# Patient Record
Sex: Female | Born: 1945
Health system: Southern US, Community
[De-identification: ages and names within clinical notes are randomized; demographics above are authoritative.]

## PROBLEM LIST (undated history)

## (undated) DIAGNOSIS — I10 Essential (primary) hypertension: Secondary | ICD-10-CM

---

## 1998-04-05 ENCOUNTER — Encounter: Admission: RE | Admit: 1998-04-05 | Discharge: 1998-04-05 | Payer: Self-pay | Admitting: Sports Medicine

## 1998-04-28 ENCOUNTER — Encounter: Admission: RE | Admit: 1998-04-28 | Discharge: 1998-04-28 | Payer: Self-pay | Admitting: Sports Medicine

## 1999-02-27 ENCOUNTER — Encounter: Admission: RE | Admit: 1999-02-27 | Discharge: 1999-02-27 | Payer: Self-pay | Admitting: Family Medicine

## 1999-03-20 ENCOUNTER — Encounter: Admission: RE | Admit: 1999-03-20 | Discharge: 1999-03-20 | Payer: Self-pay | Admitting: Family Medicine

## 1999-03-20 ENCOUNTER — Ambulatory Visit (HOSPITAL_COMMUNITY): Admission: RE | Admit: 1999-03-20 | Discharge: 1999-03-20 | Payer: Self-pay | Admitting: Family Medicine

## 1999-03-27 ENCOUNTER — Encounter: Admission: RE | Admit: 1999-03-27 | Discharge: 1999-03-27 | Payer: Self-pay | Admitting: Family Medicine

## 2014-10-11 ENCOUNTER — Other Ambulatory Visit: Payer: Self-pay | Admitting: Family

## 2014-10-11 DIAGNOSIS — Z1239 Encounter for other screening for malignant neoplasm of breast: Secondary | ICD-10-CM

## 2014-10-11 DIAGNOSIS — M810 Age-related osteoporosis without current pathological fracture: Secondary | ICD-10-CM

## 2014-10-13 ENCOUNTER — Other Ambulatory Visit: Payer: Self-pay | Admitting: Family

## 2014-10-13 ENCOUNTER — Ambulatory Visit
Admission: RE | Admit: 2014-10-13 | Discharge: 2014-10-13 | Disposition: A | Discharge status: Home / Self Care | Payer: PRIVATE HEALTH INSURANCE | Source: Ambulatory Visit | Attending: Family | Admitting: Family

## 2014-10-13 DIAGNOSIS — M25551 Pain in right hip: Secondary | ICD-10-CM

## 2014-10-13 DIAGNOSIS — R05 Cough: Secondary | ICD-10-CM

## 2014-10-13 DIAGNOSIS — M546 Pain in thoracic spine: Secondary | ICD-10-CM

## 2014-10-13 DIAGNOSIS — R053 Chronic cough: Secondary | ICD-10-CM

## 2014-10-13 DIAGNOSIS — M5440 Lumbago with sciatica, unspecified side: Secondary | ICD-10-CM

## 2014-10-14 ENCOUNTER — Encounter (HOSPITAL_COMMUNITY): Payer: Self-pay | Admitting: Emergency Medicine

## 2014-10-14 ENCOUNTER — Emergency Department (HOSPITAL_COMMUNITY)
Admission: EM | Admit: 2014-10-14 | Discharge: 2014-10-14 | Disposition: A | Discharge status: Home / Self Care | Payer: Medicare (Managed Care) | Attending: Emergency Medicine | Admitting: Emergency Medicine

## 2014-10-14 DIAGNOSIS — Z720 Tobacco use: Secondary | ICD-10-CM | POA: Diagnosis not present

## 2014-10-14 DIAGNOSIS — S32591A Other specified fracture of right pubis, initial encounter for closed fracture: Secondary | ICD-10-CM

## 2014-10-14 DIAGNOSIS — W1839XA Other fall on same level, initial encounter: Secondary | ICD-10-CM | POA: Diagnosis not present

## 2014-10-14 DIAGNOSIS — S79911A Unspecified injury of right hip, initial encounter: Secondary | ICD-10-CM | POA: Diagnosis present

## 2014-10-14 DIAGNOSIS — Y9389 Activity, other specified: Secondary | ICD-10-CM | POA: Insufficient documentation

## 2014-10-14 DIAGNOSIS — S32501A Unspecified fracture of right pubis, initial encounter for closed fracture: Secondary | ICD-10-CM | POA: Insufficient documentation

## 2014-10-14 DIAGNOSIS — I10 Essential (primary) hypertension: Secondary | ICD-10-CM | POA: Diagnosis not present

## 2014-10-14 DIAGNOSIS — Y9289 Other specified places as the place of occurrence of the external cause: Secondary | ICD-10-CM | POA: Insufficient documentation

## 2014-10-14 HISTORY — DX: Essential (primary) hypertension: I10

## 2014-10-14 MED ORDER — HYDROMORPHONE HCL 1 MG/ML IJ SOLN
0.5000 mg | Freq: Once | INTRAMUSCULAR | Status: AC
  Administered 2014-10-14: 0.5 mg via INTRAMUSCULAR
  Filled 2014-10-14: qty 1

## 2014-10-14 MED ORDER — ONDANSETRON 4 MG PO TBDP
8.0000 mg | ORAL_TABLET | Freq: Once | ORAL | Status: AC
  Administered 2014-10-14: 8 mg via ORAL
  Filled 2014-10-14: qty 2

## 2014-10-14 MED ORDER — OXYCODONE-ACETAMINOPHEN 5-325 MG PO TABS: 0.5000 | ORAL_TABLET | ORAL | Status: DC | PRN

## 2014-10-14 NOTE — ED Provider Notes (Signed)
CSN: [id number]     Arrival date & time 10/14/14  1205 History   First MD Initiated Contact with Patient 10/14/14 1512     Chief Complaint  Patient presents with  . Fall  . Hip Pain     (Consider location/radiation/quality/duration/timing/severity/associated sxs/prior Treatment) HPI Comments: Patient presents to ER for evaluation of right hip area pain. Patient reports that she had a mechanical fall one week ago. She has been having pain in the area of the right groin and hip ever since. Pain is constant, moderate worsens with bearing weight and walking. She did not have any head injury. No headaches. No neck or back pain.  Patient reports that she had x-rays performed yesterday and was told that she had a hip fracture.  Patient is a 68 y.o. female presenting with fall and hip pain.  Fall  Hip Pain    Past Medical History  Diagnosis Date  . Hypertension    History reviewed. No pertinent past surgical history. History reviewed. No pertinent family history. History  Substance Use Topics  . Smoking status: Current Every Day Smoker  . Smokeless tobacco: Not on file  . Alcohol Use: Yes   OB History   Grav Para Term Preterm Abortions TAB SAB Ect Mult Living                 Review of Systems  Musculoskeletal: Negative for back pain and neck pain.  All other systems reviewed and are negative.     Allergies  Hydrocodone  Home Medications   Prior to Admission medications   Not on File   BP 176/53  Pulse 76  Temp(Src) 98.3 F (36.8 C)  Resp 20  Ht 5\' 7"  (1.702 m)  Wt 120 lb (54.432 kg)  BMI 18.79 kg/m2  SpO2 95% Physical Exam  Constitutional: She is oriented to person, place, and time. She appears well-developed and well-nourished. No distress.  HENT:  Head: Normocephalic and atraumatic.  Right Ear: Hearing normal.  Left Ear: Hearing normal.  Nose: Nose normal.  Mouth/Throat: Oropharynx is clear and moist and mucous membranes are normal.  Eyes:  Conjunctivae and EOM are normal. Pupils are equal, round, and reactive to light.  Neck: Normal range of motion. Neck supple.  Cardiovascular: Regular rhythm, S1 normal and S2 normal.  Exam reveals no gallop and no friction rub.   No murmur heard. Pulmonary/Chest: Effort normal and breath sounds normal. No respiratory distress. She exhibits no tenderness.  Abdominal: Soft. Normal appearance and bowel sounds are normal. There is no hepatosplenomegaly. There is no tenderness. There is no rebound, no guarding, no tenderness at McBurney's point and negative Murphy's sign. No hernia.  Musculoskeletal: Normal range of motion.       Right hip: She exhibits tenderness. She exhibits normal range of motion and no deformity.  Neurological: She is alert and oriented to person, place, and time. She has normal strength. No cranial nerve deficit or sensory deficit. Coordination normal. GCS eye subscore is 4. GCS verbal subscore is 5. GCS motor subscore is 6.  Skin: Skin is warm, dry and intact. No rash noted. No cyanosis.  Psychiatric: She has a normal mood and affect. Her speech is normal and behavior is normal. Thought content normal.    ED Course  Procedures (including critical care time) Labs Review Labs Reviewed - No data to display  Imaging Review Dg Hip Complete Right  10/13/2014   CLINICAL DATA:  Acute hip pain, right.  Fall 1 week ago  in her home.  EXAM: RIGHT HIP - COMPLETE 2+ VIEW  COMPARISON:  None.  FINDINGS: There are acute appearing fractures of the right superior and inferior pubic rami. The fracture involving the superior pubic ramus on the right appears segmental. Hip joint space is maintained bilaterally.  IMPRESSION: Right superior and inferior pubic rami fractures.   Electronically Signed   By: Leanna BattlesMelinda  Blietz M.D.   On: 10/13/2014 12:27     EKG Interpretation None      MDM   Final diagnoses:  None   right inferior and superior pubic rami fracture  Patient presents to the ER  for evaluation of pain in the right hip and groin area for one week after a fall. X-rays performed yesterday were reviewed. She has superior and inferior pubic rami fracture on the right, no evidence of femur fracture. This is a weightbearing as tolerated injury. Patient will be provided pain control and followup with orthopedics.    Gilda Creasehristopher J. Kania Regnier, MD 10/14/14 [phone]

## 2014-10-14 NOTE — Discharge Instructions (Signed)
Stable Pelvic Fracture °You have one or more fractures (this means there is a break in the bones) of the pelvis. The pelvis is the ring of bones that make up your hipbones. These are the bones you sit on and the lower part of the spine. It is like a boney ring where your legs attach and which supports your upper body. You have an undisplaced fracture. This means the bones are in good position. The pelvic fracture you have is a simple (uncomplicated) fracture. °DIAGNOSIS  °X-rays usually diagnose these fractures. °TREATMENT  °The goal of treating pelvic fractures is to get the bones to heal in a good position. The patient should return to normal activities as soon as possible. Such fractures are often treated with normal bed rest and conservative measures.  °HOME CARE INSTRUCTIONS  °· You should be on bed rest for as long as directed by your caregiver. Change positions of your legs every 1-2 hours to maintain good blood flow. You may sit as long as is tolerable. Following this, you may do usual activities, but avoid strenuous activities for as long as directed by your caregiver. °· Only take over-the-counter or prescription medicines for pain, discomfort, or fever as directed by your caregiver. °· Bed rest may also be used for discomfort. °· Resume your activities when you are able. Use a cane or crutch on the injured side to reduce pain while walking, as needed. °· If you develop increased pain or discomfort not relieved with medications, contact your caregiver. °· Warning: Do not drive a car or operate a motor vehicle until your caregiver specifically tells you it is safe to do so. °SEEK IMMEDIATE MEDICAL CARE IF:  °· You feel light-headed or faint, develop chest pain or shortness of breath. °· An unexplained oral temperature above 102° F (38.9° C) develops. °· You develop blood in the urine or in the stools. °· There is difficulty urinating, and/or having a bowel movement, or pain with these efforts. °· There is a  difficulty or increased pain with walking. °· There is swelling in one or both legs that is not normal. °Document Released: 02/24/2002 Document Revised: 05/02/2014 Document Reviewed: 07/29/2008 °Exit[employer]® Patient Information ©2015 [employer]. This information is not intended to replace advice given to you by your health care provider. Make sure you discuss any questions you have with your health care provider. ° °

## 2014-10-14 NOTE — ED Notes (Signed)
Pt sent here for eval of pelvic fx from fall one week ago; pt had xrays yesterday

## 2014-10-31 ENCOUNTER — Other Ambulatory Visit: Payer: Self-pay

## 2014-10-31 ENCOUNTER — Ambulatory Visit: Payer: Self-pay

## 2014-11-02 ENCOUNTER — Ambulatory Visit: Payer: Self-pay

## 2014-11-02 ENCOUNTER — Other Ambulatory Visit: Payer: Self-pay

## 2015-07-26 ENCOUNTER — Other Ambulatory Visit: Payer: Self-pay | Admitting: Family

## 2015-07-26 DIAGNOSIS — E2839 Other primary ovarian failure: Secondary | ICD-10-CM

## 2016-01-18 ENCOUNTER — Institutional Professional Consult (permissible substitution): Payer: PRIVATE HEALTH INSURANCE | Admitting: Internal Medicine

## 2016-02-16 ENCOUNTER — Ambulatory Visit
Admission: RE | Admit: 2016-02-16 | Discharge: 2016-02-16 | Disposition: A | Discharge status: Home / Self Care | Payer: Medicare Other | Source: Ambulatory Visit | Attending: Family | Admitting: Family

## 2016-02-16 ENCOUNTER — Other Ambulatory Visit: Payer: Self-pay | Admitting: Family

## 2016-02-16 DIAGNOSIS — S22009A Unspecified fracture of unspecified thoracic vertebra, initial encounter for closed fracture: Secondary | ICD-10-CM

## 2016-08-15 ENCOUNTER — Other Ambulatory Visit: Payer: Self-pay | Admitting: Family

## 2016-08-15 DIAGNOSIS — R1901 Right upper quadrant abdominal swelling, mass and lump: Secondary | ICD-10-CM

## 2016-08-27 ENCOUNTER — Ambulatory Visit
Admission: RE | Admit: 2016-08-27 | Discharge: 2016-08-27 | Disposition: A | Discharge status: Home / Self Care | Payer: Medicare Other | Source: Ambulatory Visit | Attending: Family | Admitting: Family

## 2016-08-27 DIAGNOSIS — R1901 Right upper quadrant abdominal swelling, mass and lump: Secondary | ICD-10-CM

## 2016-09-26 ENCOUNTER — Other Ambulatory Visit: Payer: Self-pay | Admitting: Internal Medicine

## 2016-09-26 ENCOUNTER — Institutional Professional Consult (permissible substitution): Payer: Medicare Other | Admitting: Pulmonary Disease

## 2016-09-26 DIAGNOSIS — R160 Hepatomegaly, not elsewhere classified: Secondary | ICD-10-CM

## 2016-09-26 DIAGNOSIS — R932 Abnormal findings on diagnostic imaging of liver and biliary tract: Secondary | ICD-10-CM

## 2016-10-02 ENCOUNTER — Ambulatory Visit
Admission: RE | Admit: 2016-10-02 | Discharge: 2016-10-02 | Disposition: A | Discharge status: Home / Self Care | Payer: Medicare Other | Source: Ambulatory Visit | Attending: Internal Medicine | Admitting: Internal Medicine

## 2016-10-02 DIAGNOSIS — R932 Abnormal findings on diagnostic imaging of liver and biliary tract: Secondary | ICD-10-CM

## 2016-10-02 DIAGNOSIS — R160 Hepatomegaly, not elsewhere classified: Secondary | ICD-10-CM

## 2016-10-02 MED ORDER — IOPAMIDOL (ISOVUE-300) INJECTION 61%
100.0000 mL | Freq: Once | INTRAVENOUS | Status: AC | PRN
  Administered 2016-10-02: 100 mL via INTRAVENOUS

## 2016-10-31 ENCOUNTER — Other Ambulatory Visit: Payer: Self-pay | Admitting: Family

## 2016-10-31 DIAGNOSIS — M545 Low back pain: Secondary | ICD-10-CM

## 2016-11-10 ENCOUNTER — Other Ambulatory Visit: Payer: Medicare Other

## 2016-11-23 ENCOUNTER — Inpatient Hospital Stay: Admission: RE | Admit: 2016-11-23 | Payer: Medicare Other | Source: Ambulatory Visit

## 2016-12-03 ENCOUNTER — Encounter: Payer: Self-pay | Admitting: Internal Medicine

## 2016-12-03 ENCOUNTER — Ambulatory Visit (INDEPENDENT_AMBULATORY_CARE_PROVIDER_SITE_OTHER)
Admission: RE | Admit: 2016-12-03 | Discharge: 2016-12-03 | Disposition: A | Discharge status: Home / Self Care | Payer: Medicare Other | Source: Ambulatory Visit | Attending: Internal Medicine | Admitting: Internal Medicine

## 2016-12-03 ENCOUNTER — Ambulatory Visit (INDEPENDENT_AMBULATORY_CARE_PROVIDER_SITE_OTHER): Payer: Medicare Other | Admitting: Internal Medicine

## 2016-12-03 VITALS — BP 122/68 | HR 64 | Ht 65.0 in | Wt 115.4 lb

## 2016-12-03 DIAGNOSIS — R05 Cough: Secondary | ICD-10-CM

## 2016-12-03 DIAGNOSIS — J449 Chronic obstructive pulmonary disease, unspecified: Secondary | ICD-10-CM

## 2016-12-03 DIAGNOSIS — R058 Other specified cough: Secondary | ICD-10-CM | POA: Insufficient documentation

## 2016-12-03 DIAGNOSIS — F17210 Nicotine dependence, cigarettes, uncomplicated: Secondary | ICD-10-CM

## 2016-12-03 DIAGNOSIS — I10 Essential (primary) hypertension: Secondary | ICD-10-CM | POA: Diagnosis not present

## 2016-12-03 MED ORDER — PANTOPRAZOLE SODIUM 40 MG PO TBEC: 40.0000 mg | DELAYED_RELEASE_TABLET | Freq: Every day | ORAL | 2 refills | Status: AC

## 2016-12-03 MED ORDER — BISOPROLOL FUMARATE 10 MG PO TABS: 10.0000 mg | ORAL_TABLET | Freq: Every day | ORAL | 11 refills | Status: DC

## 2016-12-03 NOTE — Progress Notes (Signed)
Subjective:     Patient ID: Erin GalaCynthia Whipp, female   DOB: 1946/06/19,  MRN: [id number]  HPI  [id number] yowf active smoker referred to pulmonary clinic 12/03/2016 by Dr   Boneta LucksJennifer Brown for sob onset around winter 2017 in setting of a chronic cough x at least since 2007   12/03/2016 1st [facility] office visit/ Romano Stigger   Chief Complaint  Patient presents with  . Pulm Consult    SOB for the past year. Notices the SOB only when active.   throat clearing x 2007 > ACEi d/cd 07/2016, seems better since stopped but not gone  [facility] = can't walk a nl pace on a flat grade s sob but does fine slow and flat / some better p saba   Does have assoc hb  No obvious day to day or daytime variability or assoc excess/ purulent sputum or mucus plugs or hemoptysis or cp or chest tightness, subjective wheeze or overt sinus  symptoms. No unusual exp hx or h/o childhood pna/ asthma or knowledge of premature birth.  Sleeping ok without nocturnal  or early am exacerbation  of respiratory  c/o's or need for noct saba. Also denies any obvious fluctuation of symptoms with weather or environmental changes or other aggravating or alleviating factors except as outlined above   Current Medications, Allergies, Complete Past Medical History, Past Surgical History, Family History, and Social History were reviewed in [facility] electronic medical record.  ROS  The following are not active complaints unless bolded sore throat, dysphagia, dental problems, itching, sneezing,  nasal congestion or excess/ purulent secretions, ear ache,   fever, chills, sweats, unintended wt loss, classically pleuritic or exertional cp,  orthopnea pnd or leg swelling, presyncope, palpitations, abdominal pain, anorexia, nausea, vomiting, diarrhea  or change in bowel or bladder habits, change in stools or urine, dysuria,hematuria,  rash, arthralgias, visual complaints, headache, numbness, weakness or ataxia or problems with walking  or coordination,  change in mood/affect or memory.              Review of Systems     Objective:   Physical Exam    amb wf freq throat clearing    Wt Readings from Last 3 Encounters:  12/03/16 115 lb 6.4 oz (52.3 kg)  10/14/14 120 lb (54.4 kg)    Vital signs reviewed  - Note on arrival 02 sats  98% on RA     HEENT: nl dentition, turbinates, and oropharynx. Nl external ear canals without cough reflex   NECK :  without JVD/Nodes/TM/ nl carotid upstrokes bilaterally   LUNGS: no acc muscle use,  Nl contour chest which is clear to A and P bilaterally without cough on insp or exp maneuvers   CV:  RRR  no s3 or murmur or increase in P2, nad no edema   ABD:  soft and nontender with nl inspiratory excursion in the supine position. No bruits or organomegaly appreciated, bowel sounds nl  MS:  Nl gait/ ext warm without deformities, calf tenderness, cyanosis or clubbing No obvious joint restrictions   SKIN: warm and dry without lesions    NEURO:  alert, approp, nl sensorium with  no motor or cerebellar deficits apparent.    CXR PA and Lateral:   12/03/2016 :    I personally reviewed images and agree with radiology impression as follows:    mod severe hyperinflation/ no acute changes        Assessment:

## 2016-12-03 NOTE — Patient Instructions (Addendum)
Only use your albuterol (Proair) as a rescue medication to be used if you can't catch your breath by resting or doing a relaxed purse lip breathing pattern.  - The less you use it, the better it will work when you need it. - Ok to use up to 2 puffs  every 4 hours if you must but call for immediate appointment if use goes up over your usual need - Don't leave home without it !!  (think of it like the spare tire for your car)   Stop metaprolol  (toprol) and start bisoprolol 10 mg daily in its place - if too strong just take one half  Pantoprazole (protonix) 40 mg   Take  30-60 min before first meal of the day and Pepcid (famotidine)  20 mg one @  bedtime until return to office - this is the best way to tell whether stomach acid is contributing to your problem.    For drainage / throat tickle try take CHLORPHENIRAMINE  4 mg - take one every 4 hours as needed - available over the counter- may cause drowsiness so start with just a bedtime dose or two and see how you tolerate it before trying in daytime     GERD (REFLUX)  is an extremely common cause of respiratory symptoms just like yours , many times with no obvious heartburn at all.    It can be treated with medication, but also with lifestyle changes including elevation of the head of your bed (ideally with 6 inch  bed blocks),  Smoking cessation, avoidance of late meals, excessive alcohol, and avoid fatty foods, chocolate, peppermint, colas, red wine, and acidic juices such as orange juice.  NO MINT OR MENTHOL PRODUCTS SO NO COUGH DROPS   USE SUGARLESS CANDY INSTEAD (Jolley ranchers or Stover's or Life Savers) or even ice chips will also do - the key is to swallow to prevent all throat clearing. NO OIL BASED VITAMINS - use powdered substitutes.    See Tammy NP w/in 2 weeks with all your medications, even over the counter meds, separated in two separate bags, the ones you take no matter what vs the ones you stop once you feel better and take only  as needed when you feel you need them.   Tammy  will generate for you a new user friendly medication calendar that will put us all on the same page re: your medication use.     Without this process, it simply isn't possible to assure that we are providing  your outpatient care  with  the attention to detail we feel you deserve.   If we cannot assure that you're getting that kind of care,  then we cannot manage your problem effectively from this clinic.  Once you have seen Tammy and we are sure that we're all on the same page with your medication use she will arrange follow up with me.  -late add:  Consider adding spiriva respimat or stiolto next ov since these are least likely to irritate her uacs

## 2016-12-04 DIAGNOSIS — F17210 Nicotine dependence, cigarettes, uncomplicated: Secondary | ICD-10-CM | POA: Insufficient documentation

## 2016-12-04 DIAGNOSIS — I10 Essential (primary) hypertension: Secondary | ICD-10-CM | POA: Insufficient documentation

## 2016-12-04 NOTE — Progress Notes (Signed)
Spoke with pt and notified of results per Dr. Wert. Pt verbalized understanding and denied any questions. 

## 2016-12-04 NOTE — Assessment & Plan Note (Signed)
Strongly prefer in this setting: Bystolic, the most beta -1  selective Beta blocker available in sample form, with bisoprolol the most selective generic choice  on the market.   Try bisoprolol 10 mg daily for now at least on short term basis until sort this all  out

## 2016-12-04 NOTE — Assessment & Plan Note (Addendum)
Spirometry 12/03/2016  FEV1 0.99 (42%)  Ratio 72 at only 4 sec fvc  with classic curvature  - 12/03/2016 trial of bisoprolol in place of high dose metaprolol >>>   - The proper method of use, as well as anticipated side effects, of a metered-dose inhaler are discussed and demonstrated to the patient. Improved effectiveness after extensive coaching during this visit to a level of approximately 75 % from a baseline of 50 %     She has severe copd but I gather is very sedentary and really not so much flow limited at this point. Before considering a trial of lama  Or laba or lama/laba would like to change to most selective BB available in generic form and see her back for repeat pfts/ work on d/c cigs and control the UACS  (see separate a/p) as any inhaler is liable to aggravate this which is her main symptom at present.  Total time devoted to counseling  > 50 % of 60 minoffice visit:  review case with pt/daughter discussion of options/alternatives/ personally creating written customized instructions  in presence of pt  then going over those specific  Instructions directly with the pt including how to use all of the meds but in particular covering each new medication in detail and the difference between the maintenance/automatic meds and the prns using an action plan format for the latter.  Please see AVS from this visit for a full list of these instructions

## 2016-12-04 NOTE — Assessment & Plan Note (Signed)

## 2016-12-04 NOTE — Assessment & Plan Note (Signed)
This is severe and chronic and apparently developed while on acei but not off x 3 months and only a little better so c/w Upper airway cough syndrome (previously labeled PNDS) , is  so named because it's frequently impossible to sort out how much is  CR/sinusitis with freq throat clearing (which can be related to primary GERD)   vs  causing  secondary (" extra esophageal")  GERD from wide swings in gastric pressure that occur with throat clearing, often  promoting self use of mint and menthol lozenges that reduce the lower esophageal sphincter tone and exacerbate the problem further in a cyclical fashion.   These are the same pts (now being labeled as having "irritable larynx syndrome" by some cough centers) who not infrequently have a history of having failed to tolerate ace inhibitors,  dry powder inhalers or biphosphonates or report having atypical/extraesophageal reflux symptoms that don't respond to standard doses of PPI  and are easily confused as having aecopd or asthma flares by even experienced allergists/ pulmonologists (myself included).   So rec avoid fosfmax for now, max rx for gerd/ 1st gen H1 per guidelines/ and low threshold to refer to [facility]/ voice center if not responding to rx   Seems very shaky on details of care and especially concept of med reconciliation.  To keep things simple, I have asked the patient to first separate medicines that are perceived as maintenance, that is to be taken daily "no matter what", from those medicines that are taken on only on an as-needed basis and I have given the patient examples of both, and then return to see our NP to generate a  detailed  medication calendar which should be followed until the next physician sees the patient and updates it.

## 2016-12-25 ENCOUNTER — Encounter: Payer: Medicare Other | Admitting: Adult Health

## 2018-01-10 IMAGING — DX DG CHEST 2V
2 series · 2 of 2 positions shown · non-contrast
Comparison: 02/16/2016.

CLINICAL DATA: Cough.

EXAM:
CHEST  2 VIEW

[chest pa]
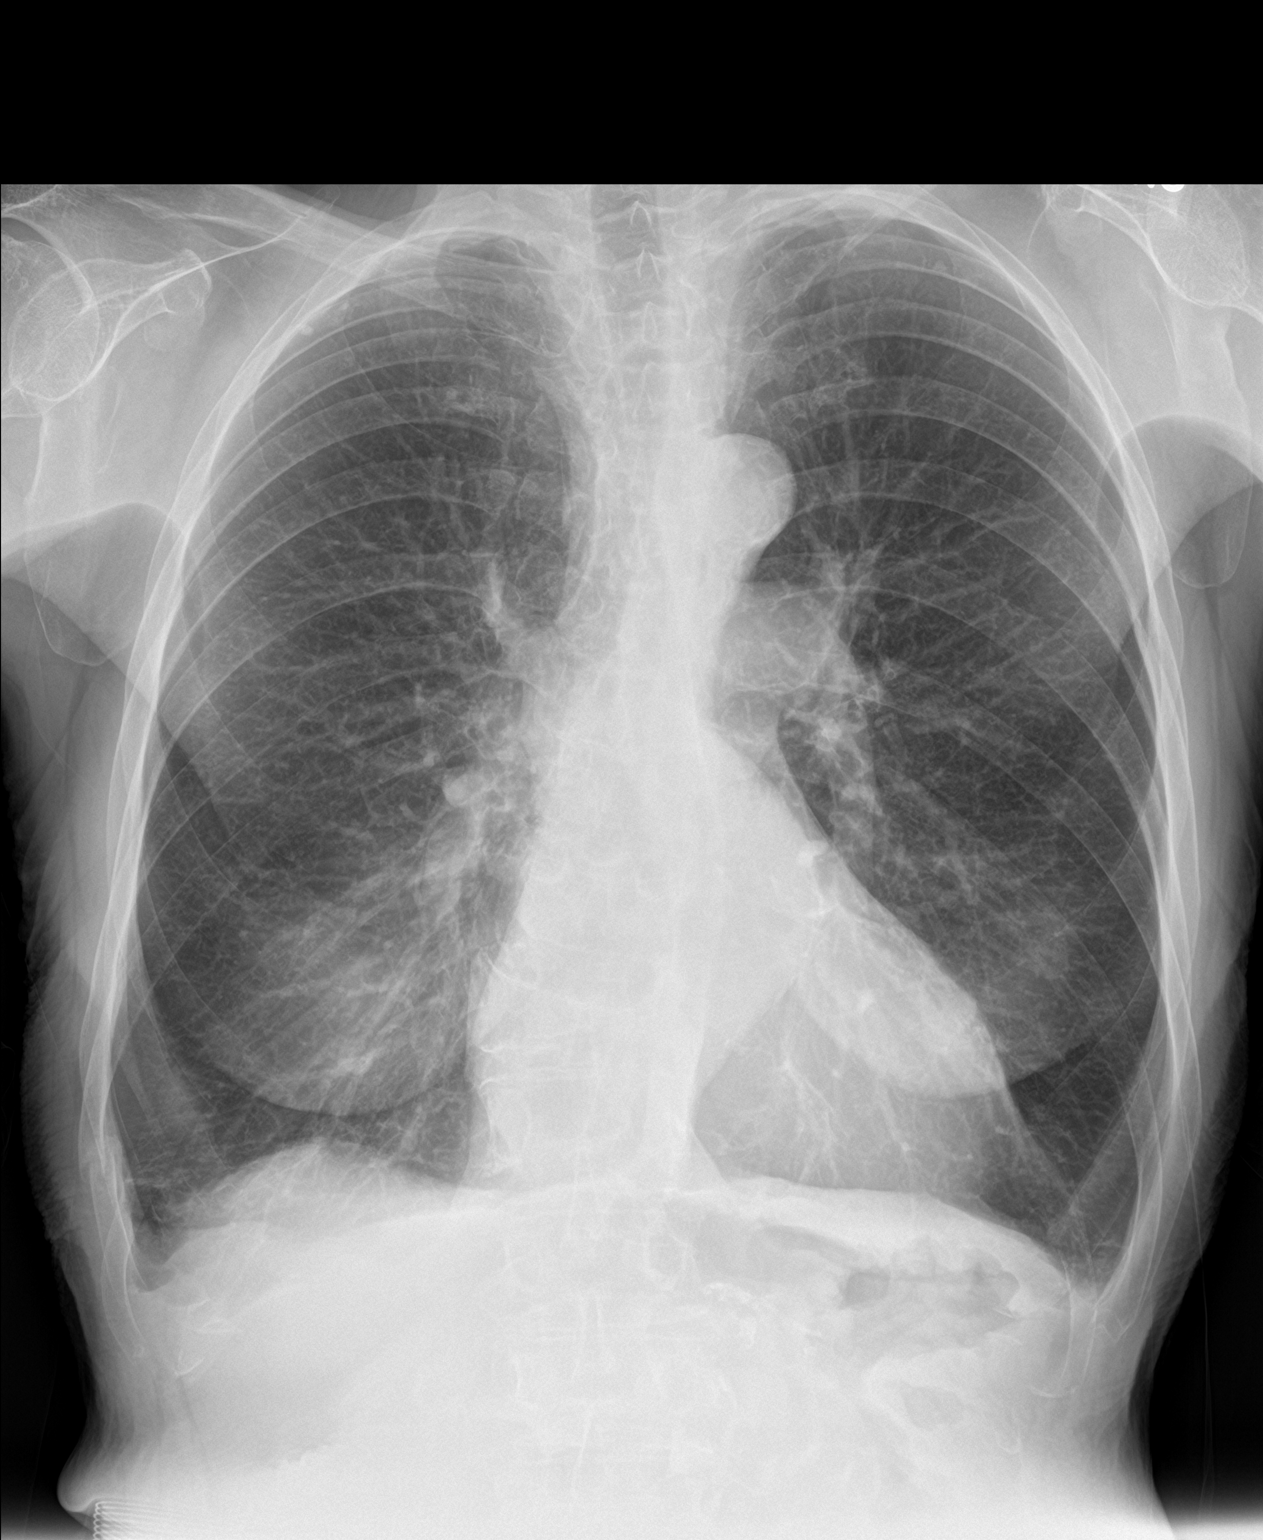

[chest lat]
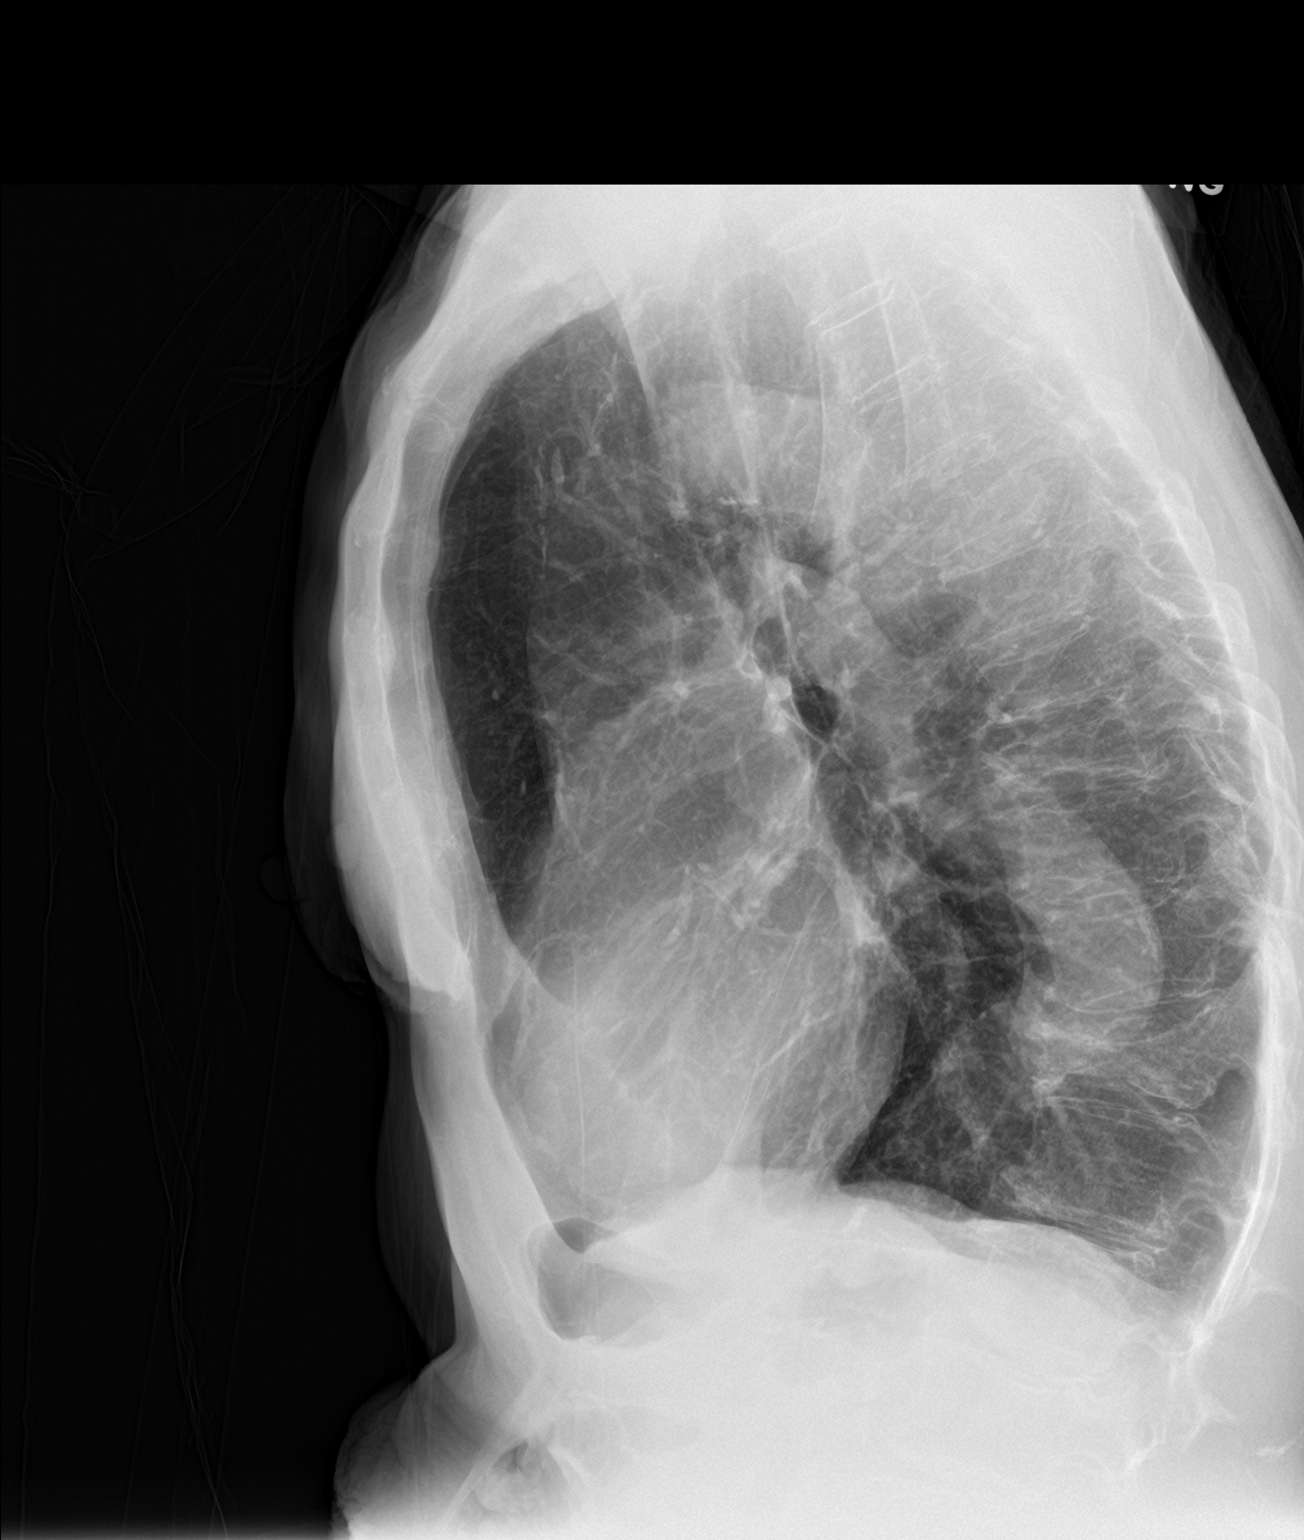

[2 of 2 positions shown; findings below may reference images not displayed]

FINDINGS: Mediastinum and hilar structures normal. Cardiomegaly. COPD. Very
mild basilar interstitial prominence noted. These changes could be
chronic. Very mild basilar interstitial edema cannot be excluded.
Tiny pleural effusions cannot be excluded. Multiple stable thoracic
vertebral body compression fractures are noted.
IMPRESSION: 1.  COPD.

2. Cardiomegaly. Very mild basilar interstitial prominence noted.
These changes could be chronic. Very mild basilar interstitial edema
cannot be excluded. Tiny pleural effusions cannot be excluded.

3.  Multiple stable vertebral body compression fractures.

## 2018-01-19 ENCOUNTER — Emergency Department (HOSPITAL_COMMUNITY)
Admission: EM | Admit: 2018-01-19 | Discharge: 2018-01-19 | Disposition: A | Discharge status: Home / Self Care | Payer: Medicare Other | Attending: Emergency Medicine | Admitting: Emergency Medicine

## 2018-01-19 ENCOUNTER — Other Ambulatory Visit: Payer: Self-pay

## 2018-01-19 ENCOUNTER — Encounter (HOSPITAL_COMMUNITY): Payer: Self-pay | Admitting: *Deleted

## 2018-01-19 DIAGNOSIS — Z79899 Other long term (current) drug therapy: Secondary | ICD-10-CM | POA: Diagnosis not present

## 2018-01-19 DIAGNOSIS — Z91040 Latex allergy status: Secondary | ICD-10-CM | POA: Diagnosis not present

## 2018-01-19 DIAGNOSIS — I10 Essential (primary) hypertension: Secondary | ICD-10-CM | POA: Diagnosis not present

## 2018-01-19 DIAGNOSIS — F17210 Nicotine dependence, cigarettes, uncomplicated: Secondary | ICD-10-CM | POA: Diagnosis not present

## 2018-01-19 DIAGNOSIS — J449 Chronic obstructive pulmonary disease, unspecified: Secondary | ICD-10-CM | POA: Insufficient documentation

## 2018-01-19 DIAGNOSIS — Z7982 Long term (current) use of aspirin: Secondary | ICD-10-CM | POA: Insufficient documentation

## 2018-01-19 LAB — COMPREHENSIVE METABOLIC PANEL
ALBUMIN: 3.6 g/dL (ref 3.5–5.0)
ALT: 12 U/L — ABNORMAL LOW (ref 14–54)
AST: 30 U/L (ref 15–41)
Alkaline Phosphatase: 66 U/L (ref 38–126)
Anion gap: 10 (ref 5–15)
BILIRUBIN TOTAL: 0.8 mg/dL (ref 0.3–1.2)
BUN: 11 mg/dL (ref 6–20)
CO2: 28 mmol/L (ref 22–32)
Calcium: 8.9 mg/dL (ref 8.9–10.3)
Chloride: 94 mmol/L — ABNORMAL LOW (ref 101–111)
Creatinine, Ser: 0.82 mg/dL (ref 0.44–1.00)
GFR calc Af Amer: >60 mL/min (ref >60)
GFR calc non Af Amer: >60 mL/min (ref >60)
Glucose, Bld: 100 mg/dL — ABNORMAL HIGH (ref 65–99)
Potassium: 3 mmol/L — ABNORMAL LOW (ref 3.5–5.1)
Sodium: 132 mmol/L — ABNORMAL LOW (ref 135–145)
TOTAL PROTEIN: 6.7 g/dL (ref 6.5–8.1)

## 2018-01-19 LAB — CBC WITH DIFFERENTIAL/PLATELET
BASOS ABS: 0 10*3/uL (ref 0.0–0.1)
BASOS PCT: 0 %
Eosinophils Absolute: 0.1 10*3/uL (ref 0.0–0.7)
Eosinophils Relative: 2 %
HEMATOCRIT: 39 % (ref 36.0–46.0)
HEMOGLOBIN: 13.1 g/dL (ref 12.0–15.0)
Lymphocytes Relative: 28 %
Lymphs Abs: 1.6 10*3/uL (ref 0.7–4.0)
MCH: 34.5 pg — ABNORMAL HIGH (ref 26.0–34.0)
MCHC: 33.6 g/dL (ref 30.0–36.0)
MCV: 102.6 fL — ABNORMAL HIGH (ref 78.0–100.0)
MONO ABS: 0.7 10*3/uL (ref 0.1–1.0)
Monocytes Relative: 12 %
NEUTROS PCT: 58 %
Neutro Abs: 3.3 10*3/uL (ref 1.7–7.7)
Platelets: 295 10*3/uL (ref 150–400)
RBC: 3.8 MIL/uL — ABNORMAL LOW (ref 3.87–5.11)
RDW: 13.2 % (ref 11.5–15.5)
WBC: 5.7 10*3/uL (ref 4.0–10.5)

## 2018-01-19 MED ORDER — POTASSIUM CHLORIDE ER 10 MEQ PO TBCR: 10.0000 meq | EXTENDED_RELEASE_TABLET | Freq: Every day | ORAL | 0 refills | Status: DC

## 2018-01-19 MED ORDER — POTASSIUM CHLORIDE CRYS ER 20 MEQ PO TBCR
40.0000 meq | EXTENDED_RELEASE_TABLET | Freq: Once | ORAL | Status: AC
  Administered 2018-01-19: 40 meq via ORAL
  Filled 2018-01-19: qty 2

## 2018-01-19 MED ORDER — HYDRALAZINE HCL 20 MG/ML IJ SOLN
5.0000 mg | Freq: Once | INTRAMUSCULAR | Status: AC
  Administered 2018-01-19: 5 mg via INTRAVENOUS
  Filled 2018-01-19: qty 1

## 2018-01-19 NOTE — ED Notes (Signed)
Pt's family states that her BP was 200/100 prior to arriving.

## 2018-01-19 NOTE — ED Triage Notes (Signed)
[facility], pt seen there today for annual physical, pt reports compliance with Bisoprolol and Losartan, pt denies headache, dizziness, n/v/d, pt denies CP & SOB, PT A&O x4

## 2018-01-19 NOTE — Discharge Instructions (Signed)
Begin taking 20 mg a day of the zebeta.  He has been taking 10 mg a day.  Start taking the potassium prescription.  Follow-up with your primary care doctor is the end of this week or beginning next week

## 2018-01-19 NOTE — ED Provider Notes (Signed)
[facility] EMERGENCY DEPARTMENT Provider Note   CSN: [id number] Arrival date & time: 01/19/18  1340     History   Chief Complaint Chief Complaint  Patient presents with  . Hypertension    HPI Bellany Elbaum is a 72 y.o. female.  Patient states she was at her doctor's office and they found her blood pressure to be too high so they sent her to the emergency department   The history is provided by the patient. No language interpreter was used.  Hypertension  This is a recurrent problem. The current episode started more than 1 week ago. The problem occurs constantly. The problem has not changed since onset.Pertinent negatives include no chest pain, no abdominal pain and no headaches. Nothing aggravates the symptoms. Nothing relieves the symptoms. She has tried nothing for the symptoms. The treatment provided no relief.    Past Medical History:  Diagnosis Date  . Hypertension     Patient Active Problem List   Diagnosis Date Noted  . Essential hypertension 12/04/2016  . Cigarette smoker 12/04/2016  . COPD GOLD III/ still smoking  12/03/2016  . Upper airway cough syndrome 12/03/2016    History reviewed. No pertinent surgical history.  OB History    No data available       Home Medications    Prior to Admission medications   Medication Sig Start Date End Date Taking? Authorizing Provider  albuterol (PROVENTIL HFA;VENTOLIN HFA) 108 (90 Base) MCG/ACT inhaler Inhale 2 puffs into the lungs every 6 (six) hours as needed for wheezing or shortness of breath.   Yes [provider]  albuterol (PROVENTIL) (2.5 MG/3ML) 0.083% nebulizer solution Take 2.5 mg by nebulization every 6 (six) hours as needed for wheezing or shortness of breath.   Yes [provider]  aspirin EC 81 MG tablet Take 81 mg by mouth daily.   Yes [provider]  bisoprolol (ZEBETA) 10 MG tablet Take 1 tablet (10 mg total) by mouth daily. 12/03/16  Yes Nyoka Cowden, MD  Cyanocobalamin (VITAMIN B-12) 500 MCG SUBL Place 1,000 mcg under the tongue daily.   Yes [provider]  fluticasone (FLONASE) 50 MCG/ACT nasal spray Place 1-2 sprays into both nostrils daily as needed for allergies or rhinitis.   Yes [provider]  folic acid (FOLVITE) 400 MCG tablet Take 400 mcg by mouth daily.   Yes [provider]  ibuprofen (ADVIL,MOTRIN) 800 MG tablet Take 800 mg by mouth every 8 (eight) hours as needed for moderate pain.   Yes [provider]  losartan-hydrochlorothiazide (HYZAAR) 100-25 MG tablet Take 1 tablet by mouth daily. 10/13/17  Yes [provider]  Multiple Vitamin (MULTIVITAMIN WITH MINERALS) TABS tablet Take 1 tablet by mouth daily.   Yes [provider]  oxymetazoline (DRISTAN SPRAY) 0.05 % nasal spray Place 1 spray into both nostrils 2 (two) times daily.   Yes [provider]  simvastatin (ZOCOR) 20 MG tablet Take 20 mg by mouth every evening.   Yes [provider]  traMADol (ULTRAM) 50 MG tablet Take 50 mg by mouth every 6 (six) hours as needed for moderate pain.   Yes [provider]  vitamin E 200 UNIT capsule Take 200 Units by mouth daily.   Yes [provider]  pantoprazole (PROTONIX) 40 MG tablet Take 1 tablet (40 mg total) by mouth daily. Patient not taking: Reported on 01/19/2018 12/03/16   Nyoka Cowden, MD  potassium chloride (K-DUR) 10 MEQ tablet  Take 1 tablet (10 mEq total) by mouth daily. 01/19/18   Bethann Berkshire, MD    Family History No family history on file.  Social History Social History   Tobacco Use  . Smoking status: Current Every Day Smoker    Packs/day: 0.50    Types: Cigarettes  . Smokeless tobacco: Never Used  Substance Use Topics  . Alcohol use: Yes  . Drug use: No     Allergies   Hydrocodone and Latex   Review of Systems Review of Systems  Constitutional: Negative for appetite change and fatigue.  HENT: Negative for  congestion, ear discharge and sinus pressure.   Eyes: Negative for discharge.  Respiratory: Negative for cough.   Cardiovascular: Negative for chest pain.  Gastrointestinal: Negative for abdominal pain and diarrhea.  Genitourinary: Negative for frequency and hematuria.  Musculoskeletal: Negative for back pain.  Skin: Negative for rash.  Neurological: Negative for seizures and headaches.  Psychiatric/Behavioral: Negative for hallucinations.     Physical Exam Updated Vital Signs BP (!) 159/73   Pulse (!) 58   Temp 98.2 F (36.8 C) (Oral)   Resp 17   Ht 5\' 4"  (1.626 m)   Wt 53.1 kg (117 lb)   SpO2 97%   BMI 20.08 kg/m   Physical Exam  Constitutional: She is oriented to person, place, and time. She appears well-developed.  HENT:  Head: Normocephalic.  Eyes: Conjunctivae and EOM are normal. No scleral icterus.  Neck: Neck supple. No thyromegaly present.  Cardiovascular: Normal rate and regular rhythm. Exam reveals no gallop and no friction rub.  No murmur heard. Pulmonary/Chest: No stridor. She has no wheezes. She has no rales. She exhibits no tenderness.  Abdominal: She exhibits no distension. There is no tenderness. There is no rebound.  Musculoskeletal: Normal range of motion. She exhibits no edema.  Lymphadenopathy:    She has no cervical adenopathy.  Neurological: She is oriented to person, place, and time. She exhibits normal muscle tone. Coordination normal.  Skin: No rash noted. No erythema.  Psychiatric: She has a normal mood and affect. Her behavior is normal.     ED Treatments / Results  Labs (all labs ordered are listed, but only abnormal results are displayed) Labs Reviewed  CBC WITH DIFFERENTIAL/PLATELET - Abnormal; Notable for the following components:      Result Value   RBC 3.80 (*)    MCV 102.6 (*)    MCH 34.5 (*)    All other components within normal limits  COMPREHENSIVE METABOLIC PANEL - Abnormal; Notable for the following components:   Sodium  132 (*)    Potassium 3.0 (*)    Chloride 94 (*)    Glucose, Bld 100 (*)    ALT 12 (*)    All other components within normal limits    EKG  EKG Interpretation None       Radiology No results found.  Procedures Procedures (including critical care time)  Medications Ordered in ED Medications  potassium chloride SA (K-DUR,KLOR-CON) CR tablet 40 mEq (40 mEq Oral Given 01/19/18 1658)  hydrALAZINE (APRESOLINE) injection 5 mg (5 mg Intravenous Given 01/19/18 1658)     Initial Impression / Assessment and Plan / ED Course  I have reviewed the triage vital signs and the nursing notes.  Pertinent labs & imaging results that were available during my care of the patient were reviewed by me and considered in my medical decision making (see chart for details).    Patient with poorly controlled blood  pressure and hyperkalemia.  We will increase her Zebeta to 20 mg a day and start on potassium and she will follow-up with her PCP   Final Clinical Impressions(s) / ED Diagnoses   Final diagnoses:  Essential hypertension    ED Discharge Orders        Ordered    potassium chloride (K-DUR) 10 MEQ tablet  Daily     01/19/18 1754       Bethann BerkshireZammit, Bryer Gottsch, MD 01/19/18 1800

## 2018-02-16 ENCOUNTER — Encounter: Payer: Self-pay | Admitting: Cardiology

## 2018-03-16 ENCOUNTER — Encounter: Payer: Self-pay | Admitting: Cardiology

## 2018-03-25 ENCOUNTER — Other Ambulatory Visit: Payer: Self-pay | Admitting: Cardiology

## 2018-03-25 DIAGNOSIS — I739 Peripheral vascular disease, unspecified: Secondary | ICD-10-CM

## 2018-03-25 DIAGNOSIS — I10 Essential (primary) hypertension: Secondary | ICD-10-CM

## 2018-03-26 ENCOUNTER — Ambulatory Visit (INDEPENDENT_AMBULATORY_CARE_PROVIDER_SITE_OTHER)
Admission: RE | Admit: 2018-03-26 | Discharge: 2018-03-26 | Disposition: A | Discharge status: Home / Self Care | Payer: Medicare Other | Source: Ambulatory Visit | Attending: Vascular Surgery | Admitting: Vascular Surgery

## 2018-03-26 ENCOUNTER — Ambulatory Visit (HOSPITAL_COMMUNITY)
Admission: RE | Admit: 2018-03-26 | Discharge: 2018-03-26 | Disposition: A | Discharge status: Home / Self Care | Payer: Medicare Other | Source: Ambulatory Visit | Attending: Vascular Surgery | Admitting: Vascular Surgery

## 2018-03-26 DIAGNOSIS — I10 Essential (primary) hypertension: Secondary | ICD-10-CM | POA: Diagnosis present

## 2018-03-26 DIAGNOSIS — I739 Peripheral vascular disease, unspecified: Secondary | ICD-10-CM | POA: Diagnosis not present

## 2018-03-26 DIAGNOSIS — I7789 Other specified disorders of arteries and arterioles: Secondary | ICD-10-CM | POA: Insufficient documentation

## 2018-09-01 DIAGNOSIS — I10 Essential (primary) hypertension: Secondary | ICD-10-CM | POA: Diagnosis not present

## 2018-09-01 DIAGNOSIS — M810 Age-related osteoporosis without current pathological fracture: Secondary | ICD-10-CM | POA: Diagnosis not present

## 2018-09-01 DIAGNOSIS — M546 Pain in thoracic spine: Secondary | ICD-10-CM | POA: Diagnosis not present

## 2018-09-01 DIAGNOSIS — E7800 Pure hypercholesterolemia, unspecified: Secondary | ICD-10-CM | POA: Diagnosis not present

## 2018-09-18 DIAGNOSIS — I10 Essential (primary) hypertension: Secondary | ICD-10-CM | POA: Diagnosis not present

## 2018-09-18 DIAGNOSIS — M810 Age-related osteoporosis without current pathological fracture: Secondary | ICD-10-CM | POA: Diagnosis not present

## 2018-09-18 DIAGNOSIS — D539 Nutritional anemia, unspecified: Secondary | ICD-10-CM | POA: Diagnosis not present

## 2018-09-18 DIAGNOSIS — E7800 Pure hypercholesterolemia, unspecified: Secondary | ICD-10-CM | POA: Diagnosis not present

## 2018-10-12 DIAGNOSIS — Z1211 Encounter for screening for malignant neoplasm of colon: Secondary | ICD-10-CM | POA: Diagnosis not present

## 2019-01-18 ENCOUNTER — Other Ambulatory Visit: Payer: Self-pay | Admitting: Gastroenterology

## 2019-01-18 DIAGNOSIS — R195 Other fecal abnormalities: Secondary | ICD-10-CM | POA: Diagnosis not present

## 2019-01-18 DIAGNOSIS — Z7289 Other problems related to lifestyle: Secondary | ICD-10-CM | POA: Diagnosis not present

## 2019-01-18 DIAGNOSIS — K7030 Alcoholic cirrhosis of liver without ascites: Secondary | ICD-10-CM

## 2019-01-18 DIAGNOSIS — K219 Gastro-esophageal reflux disease without esophagitis: Secondary | ICD-10-CM | POA: Diagnosis not present

## 2019-01-22 ENCOUNTER — Ambulatory Visit
Admission: RE | Admit: 2019-01-22 | Discharge: 2019-01-22 | Disposition: A | Discharge status: Home / Self Care | Payer: Medicare Other | Source: Ambulatory Visit | Attending: Gastroenterology | Admitting: Gastroenterology

## 2019-01-22 DIAGNOSIS — K8020 Calculus of gallbladder without cholecystitis without obstruction: Secondary | ICD-10-CM | POA: Diagnosis not present

## 2019-01-22 DIAGNOSIS — K7030 Alcoholic cirrhosis of liver without ascites: Secondary | ICD-10-CM

## 2019-03-25 ENCOUNTER — Ambulatory Visit (HOSPITAL_COMMUNITY): Admit: 2019-03-25 | Payer: Medicare Other | Admitting: Gastroenterology

## 2019-03-25 ENCOUNTER — Encounter (HOSPITAL_COMMUNITY): Payer: Self-pay

## 2019-03-25 SURGERY — EGD (ESOPHAGOGASTRODUODENOSCOPY)
Anesthesia: Monitor Anesthesia Care

## 2019-03-26 ENCOUNTER — Other Ambulatory Visit: Payer: Self-pay

## 2019-03-26 NOTE — Patient Outreach (Signed)
[facility]) Care Management  03/26/2019  Erin Calderon 1946-03-30 [id number]   Medication Adherence call to [name]  HIPPA Compliant Voice message left with a call back number. [name] is showing past due on Simvastatin 20 mg under [employer].   Lillia Abed CPhT Pharmacy Technician [facility] Management Direct Dial [phone]  Fax [phone] [email or web address]

## 2019-05-05 DIAGNOSIS — G8929 Other chronic pain: Secondary | ICD-10-CM | POA: Diagnosis not present

## 2019-05-05 DIAGNOSIS — M545 Low back pain: Secondary | ICD-10-CM | POA: Diagnosis not present

## 2019-07-14 DIAGNOSIS — I10 Essential (primary) hypertension: Secondary | ICD-10-CM | POA: Diagnosis not present

## 2019-07-14 DIAGNOSIS — K219 Gastro-esophageal reflux disease without esophagitis: Secondary | ICD-10-CM | POA: Diagnosis not present

## 2019-07-14 DIAGNOSIS — E7800 Pure hypercholesterolemia, unspecified: Secondary | ICD-10-CM | POA: Diagnosis not present

## 2019-07-14 DIAGNOSIS — R0609 Other forms of dyspnea: Secondary | ICD-10-CM | POA: Diagnosis not present

## 2019-09-09 DIAGNOSIS — E7800 Pure hypercholesterolemia, unspecified: Secondary | ICD-10-CM | POA: Diagnosis not present

## 2019-09-09 DIAGNOSIS — I10 Essential (primary) hypertension: Secondary | ICD-10-CM | POA: Diagnosis not present

## 2019-09-24 ENCOUNTER — Other Ambulatory Visit: Payer: Self-pay

## 2019-09-24 NOTE — Patient Outreach (Signed)
[facility]) Care Management  09/24/2019  Laurell Coalson 1946/04/26 [id number]   Medication Adherence call to Mrs. [place] Compliant Voice message left with a call back number. Mrs. Gaynor is showing past due on Losartan 100 mg under [place].   [place] Management Direct Dial [phone]  Fax [phone] [email or web address]

## 2019-10-04 ENCOUNTER — Other Ambulatory Visit: Payer: Self-pay

## 2019-10-04 NOTE — Patient Outreach (Signed)
[facility]) Care Management  10/04/2019  Erin Calderon 1946-02-23 [id number]   Medication Adherence call to Mrs. Erin Calderon [employer] Identifiers Verify spoke with patient, she is past due on Losartan 100 mg patient explain she has not taking this medication over 2 weeks because she is not sure if she is to be taking this medication,call doctors office spoke with receptionist she explain patient has a virtual visit with the pharmacist and he will go over all her medication,call patient back for patient to expect a call from the pharmacist tomorrow at 11am pharmacist will go over all her medications. [name] is showing past due under [place].   [place] Management Direct Dial [phone]  Fax [phone] [email or web address]   [place] Management Direct Dial [phone]  Fax [phone] [email or web address]

## 2019-10-19 DIAGNOSIS — I10 Essential (primary) hypertension: Secondary | ICD-10-CM | POA: Diagnosis not present

## 2019-10-19 DIAGNOSIS — E785 Hyperlipidemia, unspecified: Secondary | ICD-10-CM | POA: Diagnosis not present

## 2019-10-19 DIAGNOSIS — E7800 Pure hypercholesterolemia, unspecified: Secondary | ICD-10-CM | POA: Diagnosis not present

## 2019-10-19 DIAGNOSIS — M810 Age-related osteoporosis without current pathological fracture: Secondary | ICD-10-CM | POA: Diagnosis not present

## 2019-10-19 DIAGNOSIS — J449 Chronic obstructive pulmonary disease, unspecified: Secondary | ICD-10-CM | POA: Diagnosis not present

## 2019-10-25 ENCOUNTER — Ambulatory Visit: Payer: Medicare Other | Admitting: Cardiology

## 2019-11-12 DIAGNOSIS — E7800 Pure hypercholesterolemia, unspecified: Secondary | ICD-10-CM | POA: Diagnosis not present

## 2019-11-12 DIAGNOSIS — J449 Chronic obstructive pulmonary disease, unspecified: Secondary | ICD-10-CM | POA: Diagnosis not present

## 2019-11-12 DIAGNOSIS — I10 Essential (primary) hypertension: Secondary | ICD-10-CM | POA: Diagnosis not present

## 2019-11-12 DIAGNOSIS — E785 Hyperlipidemia, unspecified: Secondary | ICD-10-CM | POA: Diagnosis not present

## 2019-11-12 DIAGNOSIS — M810 Age-related osteoporosis without current pathological fracture: Secondary | ICD-10-CM | POA: Diagnosis not present

## 2019-11-15 ENCOUNTER — Other Ambulatory Visit: Payer: Self-pay

## 2019-11-15 NOTE — Patient Outreach (Signed)
[facility]) Care Management  11/15/2019  Erin Calderon [birth year] [id number]   Medication Adherence call to Erin Calderon [employer] Identifiers Verify spoke with patient she is past due on Simvastatin 20 mg and Losartan 100 mg patient explain she is taking both medication doctor put he back on Losartan,patient has place an order thru the mail order  and is expecting an order in a couple of days. Erin Calderon is shoeing past due under [place].   [place] Management Direct Dial [phone]  Fax [phone] [email or web address]

## 2019-11-16 ENCOUNTER — Encounter: Payer: Self-pay | Admitting: Cardiology

## 2019-11-16 ENCOUNTER — Ambulatory Visit (INDEPENDENT_AMBULATORY_CARE_PROVIDER_SITE_OTHER): Payer: Medicare Other | Admitting: Cardiology

## 2019-11-16 ENCOUNTER — Other Ambulatory Visit: Payer: Self-pay

## 2019-11-16 ENCOUNTER — Ambulatory Visit (HOSPITAL_COMMUNITY)
Admission: RE | Admit: 2019-11-16 | Discharge: 2019-11-16 | Disposition: A | Discharge status: Home / Self Care | Payer: Medicare Other | Source: Ambulatory Visit | Attending: Cardiovascular Disease | Admitting: Cardiovascular Disease

## 2019-11-16 VITALS — BP 142/62 | HR 92 | Temp 97.2°F | Ht 65.0 in | Wt 116.0 lb

## 2019-11-16 DIAGNOSIS — I824Y9 Acute embolism and thrombosis of unspecified deep veins of unspecified proximal lower extremity: Secondary | ICD-10-CM | POA: Insufficient documentation

## 2019-11-16 DIAGNOSIS — Z716 Tobacco abuse counseling: Secondary | ICD-10-CM | POA: Diagnosis not present

## 2019-11-16 DIAGNOSIS — I10 Essential (primary) hypertension: Secondary | ICD-10-CM | POA: Diagnosis not present

## 2019-11-16 DIAGNOSIS — M7989 Other specified soft tissue disorders: Secondary | ICD-10-CM | POA: Diagnosis not present

## 2019-11-16 DIAGNOSIS — I739 Peripheral vascular disease, unspecified: Secondary | ICD-10-CM | POA: Diagnosis not present

## 2019-11-16 DIAGNOSIS — F17210 Nicotine dependence, cigarettes, uncomplicated: Secondary | ICD-10-CM | POA: Diagnosis not present

## 2019-11-16 NOTE — Progress Notes (Signed)
Cardiology Office Note:    Date:  11/16/2019   ID:  Erin Calderon, DOB November 04, 1946, MRN [id number]  PCP:  Boneta LucksBrown, Jennifer, NP  Cardiologist:  Jodelle RedBridgette Zayd Bonet, MD (prior Dr. Donnie Ahoilley)  Referring MD: Boneta LucksBrown, Jennifer, NP   CC: establish care, concern with leg edema.  History of Present Illness:    Erin Calderon is a 73 y.o. female with a hx of tobacco use, hypertension, peripheral vascular disease, hyperlipidemia, COPD who is seen as a new patient to me. She was last seen by Dr. Donnie Ahoilley on 06/18/2018.  Summary of notes from Dr. York Spanielilley's reports: Echo 03/16/2018 EF 65%, moderate cLVH, grade 1 diastolic dysfunction. Mild-moderate TR, mild-moderate PR. No documented stress/cath  Concerns today: Left leg much more swollen than right, has been going on 6-12 mos. No pain, no surgeries on that leg. Has prior ABIs but does not have vasc u/s that I can see. No history of blood clots. Never been on blood thinners. It has not changed over last several months. Has not had workup for this.   Still smoking, down to 1/4-1/2 ppd. Peak was about 2 ppd. Counseled on complete cessation today.  Checks BP at home. Doesn't remember numbers, thought they were good. Thinks they were 130s/80s. Has been labile in the past, most recently low. HCTZ cut back by Dr. Donnie Ahoilley.  Weight has been fluctuating. Peak was 160 lbs several years ago, lost weight after she lost her husband, also after a fall. Down between 40-50 lbs total.  Denies chest pain, shortness of breath at rest or with normal exertion. No PND, orthopnea, LE edema or unexpected weight gain. No syncope or palpitations.  Past Medical History:  Diagnosis Date  . Hypertension     No past surgical history on file.  Current Medications: Current Outpatient Medications on File Prior to Visit  Medication Sig  . albuterol (PROVENTIL HFA;VENTOLIN HFA) 108 (90 Base) MCG/ACT inhaler Inhale 2 puffs into the lungs every 6 (six) hours as needed for wheezing or  shortness of breath.  Marland Kitchen. albuterol (PROVENTIL) (2.5 MG/3ML) 0.083% nebulizer solution Take 2.5 mg by nebulization every 6 (six) hours as needed for wheezing or shortness of breath.  Marland Kitchen. aspirin EC 81 MG tablet Take 81 mg by mouth daily.  . bisoprolol (ZEBETA) 10 MG tablet Take 1 tablet (10 mg total) by mouth daily.  . Cyanocobalamin (VITAMIN B-12) 500 MCG SUBL Place 1,000 mcg under the tongue daily.  . fluticasone (FLONASE) 50 MCG/ACT nasal spray Place 1-2 sprays into both nostrils daily as needed for allergies or rhinitis.  . folic acid (FOLVITE) 400 MCG tablet Take 400 mcg by mouth daily.  Marland Kitchen. ibuprofen (ADVIL,MOTRIN) 800 MG tablet Take 800 mg by mouth every 8 (eight) hours as needed for moderate pain.  Marland Kitchen. losartan-hydrochlorothiazide (HYZAAR) 100-25 MG tablet Take 1 tablet by mouth daily.  . Multiple Vitamin (MULTIVITAMIN WITH MINERALS) TABS tablet Take 1 tablet by mouth daily.  Marland Kitchen. oxymetazoline (DRISTAN SPRAY) 0.05 % nasal spray Place 1 spray into both nostrils 2 (two) times daily.  . pantoprazole (PROTONIX) 40 MG tablet Take 1 tablet (40 mg total) by mouth daily. (Patient not taking: Reported on 01/19/2018)  . potassium chloride (K-DUR) 10 MEQ tablet Take 1 tablet (10 mEq total) by mouth daily.  . simvastatin (ZOCOR) 20 MG tablet Take 20 mg by mouth every evening.  . traMADol (ULTRAM) 50 MG tablet Take 50 mg by mouth every 6 (six) hours as needed for moderate pain.  . vitamin E 200 UNIT capsule  Take 200 Units by mouth daily.   No current facility-administered medications on file prior to visit.      Allergies:   Hydrocodone and Latex   Social History   Tobacco Use  . Smoking status: Current Every Day Smoker    Packs/day: 0.50    Types: Cigarettes  . Smokeless tobacco: Never Used  Substance Use Topics  . Alcohol use: Yes  . Drug use: No    Family History: Father deceased, natural causes. Mother deceased, dementia. Sister deceased, CVA/dementia. Sister deceased, cancer.  ROS:    Please see the history of present illness.  Additional pertinent ROS: Constitutional: Negative for chills, fever, night sweats. Positive for weight loss as noted HENT: Negative for ear pain and hearing loss.   Eyes: Negative for loss of vision and eye pain.  Respiratory: Negative for cough, sputum, wheezing.   Cardiovascular: See HPI. Gastrointestinal: Negative for abdominal pain, melena, does have very trace hematochezia intermittently.  Genitourinary: Negative for dysuria and hematuria.  Musculoskeletal: Negative for falls and myalgias.  Skin: Negative for itching and rash.  Neurological: Negative for focal weakness, focal sensory changes and loss of consciousness.  Endo/Heme/Allergies: Does bruise/bleed easily.     EKGs/Labs/Other Studies Reviewed:    The following studies were reviewed today: Notes from Dr. Donnie Aho  EKG:  EKG is personally reviewed.  The ekg ordered today demonstrates SR with PACs, poor R wave progression  Recent Labs: No results found for requested labs within last 8760 hours.  Recent Lipid Panel No results found for: CHOL, TRIG, HDL, CHOLHDL, VLDL, LDLCALC, LDLDIRECT  Physical Exam:    VS:  BP (!) 152/75   Pulse 92   Temp (!) 97.2 F (36.2 C)   Ht 5\' 5"  (1.651 m)   Wt 116 lb (52.6 kg)   SpO2 93%   BMI 19.30 kg/m     Wt Readings from Last 3 Encounters:  11/16/19 116 lb (52.6 kg)  01/19/18 117 lb (53.1 kg)  12/03/16 115 lb 6.4 oz (52.3 kg)    GEN: Well nourished, well developed in no acute distress HEENT: Normal, moist mucous membranes NECK: No JVD CARDIAC: regular rhythm, normal S1 and S2, 1/6 SM, no rubs/gallops  VASCULAR: Radial pulses 2+ bilaterally. DP/PT palpable on right, difficult to palpate through edema on left. RESPIRATORY:  Clear to auscultation without rales, wheezing or rhonchi  ABDOMEN: Soft, non-tender, non-distended MUSCULOSKELETAL:  Ambulates independently SKIN: Warm and dry, but left leg significantly larger than right. Pic of  leg below with patient's permission. NEUROLOGIC:  Alert and oriented x 3. No focal neuro deficits noted. PSYCHIATRIC:  Normal affect      ASSESSMENT:    1. Calf swelling   2. Hypertension, unspecified type   3. Peripheral vascular disease, unspecified (HCC)   4. Tobacco abuse counseling   5. Cigarette smoker    PLAN:    Calf swelling: she reports this has been unchanged for 6-12 months, but I am very concerned. Prominent unilateral swelling, especially in an active smoker with unintentional weight loss, has me concerned for DVT. She does have known prior PVD as well, though her foot is warm today -scheduled urgent scans, both venous and arterial, to evaluate for source -if scans unrevealing, would trial compression stocking on affected leg (mild compression) -if becomes red, warm, or painful, needs urgent evaluation  Hypertension: elevated today. Encouraged her to bring a log of home BP to next visit. Has had BP meds reduced in the past with weight loss, so  do not want to overcorrect if this is isolated elevation -continue current HCTZ-losartan, bisoprolol for now  Tobacco use, with counseling: The patient was counseled on tobacco cessation today for 7 minutes.  Counseling included reviewing the risks of smoking tobacco products, how it impacts the patient's current medical diagnoses and different strategies for quitting.  Pharmacotherapy to aid in tobacco cessation was not prescribed today. With her known PAD, this was stressed strongly.  Cardiac risk counseling and prevention recommendations: -recommend heart healthy/Mediterranean diet, with whole grains, fruits, vegetable, fish, lean meats, nuts, and olive oil. Limit salt. -recommend moderate walking, 3-5 times/week for 30-50 minutes each session. Aim for at least 150 minutes.week. Goal should be pace of 3 miles/hours, or walking 1.5 miles in 30 minutes -recommend avoidance of tobacco products. Avoid excess alcohol. -continue  aspirin, simvastatin for secondary prevention (PVD)  Plan for follow up: 6 weeks or sooner based on results of testing  Medication Adjustments/Labs and Tests Ordered: Current medicines are reviewed at length with the patient today.  Concerns regarding medicines are outlined above.  Orders Placed This Encounter  Procedures  . EKG 12-Lead  . VAS [place] LOWER EXTREMITY VENOUS (DVT)  . VAS [place] LOWER EXTREMITY ARTERIAL DUPLEX  . VAS [place] ABI WITH/WO TBI   No orders of the defined types were placed in this encounter.   Patient Instructions  Medication Instructions:  Your Physician recommend you continue on your current medication as directed.    *If you need a refill on your cardiac medications before your next appointment, please call your pharmacy*  Lab Work: None  Testing/Procedures: Your physician has requested that you have a lower extremity arterial exercise duplex. During this test, exercise and ultrasound are used to evaluate arterial blood flow in the legs. Allow one hour for this exam. There are no restrictions or special instructions.  Your physician has requested that you have an ankle brachial index (ABI). During this test an ultrasound and blood pressure cuff are used to evaluate the arteries that supply the arms and legs with blood. Allow thirty minutes for this exam. There are no restrictions or special instructions.  Your physician has requested that you have a lower extremity venous duplex. This test is an ultrasound of the veins in the legs or arms. It looks at venous blood flow that carries blood from the heart to the legs or arms. Allow one hour for a Lower Venous exam. Allow thirty minutes for an Upper Venous exam. There are no restrictions or special instructions.     Follow-Up: At [facility], you and your health needs are our priority.  As part of our continuing mission to provide you with exceptional heart care, we have created designated Provider Care Teams.   These Care Teams include your primary Cardiologist (physician) and Advanced Practice Providers (APPs -  Physician Assistants and Nurse Practitioners) who all work together to provide you with the care you need, when you need it.  Your next appointment:   6 weeks  The format for your next appointment:   In Person  Provider:   Buford Dresser, MD     Signed, Buford Dresser, MD PhD 11/16/2019 2:31 PM    [place]

## 2019-11-16 NOTE — Patient Instructions (Signed)
Medication Instructions:  Your Physician recommend you continue on your current medication as directed.    *If you need a refill on your cardiac medications before your next appointment, please call your pharmacy*  Lab Work: None  Testing/Procedures: Your physician has requested that you have a lower extremity arterial exercise duplex. During this test, exercise and ultrasound are used to evaluate arterial blood flow in the legs. Allow one hour for this exam. There are no restrictions or special instructions.  Your physician has requested that you have an ankle brachial index (ABI). During this test an ultrasound and blood pressure cuff are used to evaluate the arteries that supply the arms and legs with blood. Allow thirty minutes for this exam. There are no restrictions or special instructions.  Your physician has requested that you have a lower extremity venous duplex. This test is an ultrasound of the veins in the legs or arms. It looks at venous blood flow that carries blood from the heart to the legs or arms. Allow one hour for a Lower Venous exam. Allow thirty minutes for an Upper Venous exam. There are no restrictions or special instructions.     Follow-Up: At [facility], you and your health needs are our priority.  As part of our continuing mission to provide you with exceptional heart care, we have created designated Provider Care Teams.  These Care Teams include your primary Cardiologist (physician) and Advanced Practice Providers (APPs -  Physician Assistants and Nurse Practitioners) who all work together to provide you with the care you need, when you need it.  Your next appointment:   6 weeks  The format for your next appointment:   In Person  Provider:   Buford Dresser, MD

## 2019-11-17 ENCOUNTER — Other Ambulatory Visit: Payer: Self-pay

## 2019-11-17 ENCOUNTER — Ambulatory Visit (HOSPITAL_COMMUNITY)
Admission: RE | Admit: 2019-11-17 | Discharge: 2019-11-17 | Disposition: A | Discharge status: Home / Self Care | Payer: Medicare Other | Source: Ambulatory Visit | Attending: Cardiology | Admitting: Cardiology

## 2019-11-17 DIAGNOSIS — I739 Peripheral vascular disease, unspecified: Secondary | ICD-10-CM | POA: Insufficient documentation

## 2019-11-19 ENCOUNTER — Telehealth: Payer: Self-pay | Admitting: Cardiology

## 2019-11-19 NOTE — Telephone Encounter (Signed)
New message    Daughter Lexine Baton returning call for results for VAS [place] ABI

## 2019-11-19 NOTE — Telephone Encounter (Signed)
Patient returning call for results 

## 2019-11-21 ENCOUNTER — Encounter: Payer: Self-pay | Admitting: Cardiology

## 2019-11-22 ENCOUNTER — Telehealth: Payer: Self-pay | Admitting: Cardiology

## 2019-11-22 NOTE — Telephone Encounter (Signed)
Left message to call back  

## 2019-11-22 NOTE — Telephone Encounter (Signed)
Per staff message 11/22/19, Dr. Harrell Gave would like the patient to be set up with either Dr. Fletcher Anon or Dr. Gwenlyn Found for PAD.  Contacted patient, but she will have her daughter call the office at another time to set up the appoint,ent

## 2019-11-22 NOTE — Telephone Encounter (Signed)
Patient has an appt with Dr. Gwenlyn Found on Wednesday at 10:00. Daughter states that she goes with her mom to all her appt's even the one's with Dr. Harrell Gave. States she will need to come with her mom to this appt due to medical reasons as well. Please advise.

## 2019-11-22 NOTE — Telephone Encounter (Signed)
Patient accompanies mother to appointment- even previous with Dr.Christopher,  Okay for her to come to appointment with Dr.Berry? Will route over to advise. Thank you!

## 2019-11-22 NOTE — Telephone Encounter (Signed)
Pt's daughter updated with doppler results and MD's recommendations. Daughter agreeable to [facility] referral and stated she has already purchased compression stockings.

## 2019-11-23 NOTE — Telephone Encounter (Signed)
Fine with me

## 2019-11-23 NOTE — Telephone Encounter (Signed)
Called and LVM advising it was okay. Left call back number.

## 2019-11-24 ENCOUNTER — Encounter: Payer: Self-pay | Admitting: Cardiovascular Disease

## 2019-11-24 ENCOUNTER — Ambulatory Visit: Payer: Medicare Other | Admitting: Cardiovascular Disease

## 2019-11-24 ENCOUNTER — Other Ambulatory Visit: Payer: Self-pay

## 2019-11-24 VITALS — BP 124/60 | HR 93 | Ht 65.0 in | Wt 116.4 lb

## 2019-11-24 DIAGNOSIS — I824Y9 Acute embolism and thrombosis of unspecified deep veins of unspecified proximal lower extremity: Secondary | ICD-10-CM | POA: Diagnosis not present

## 2019-11-24 DIAGNOSIS — I739 Peripheral vascular disease, unspecified: Secondary | ICD-10-CM

## 2019-11-24 DIAGNOSIS — R0602 Shortness of breath: Secondary | ICD-10-CM | POA: Diagnosis not present

## 2019-11-24 DIAGNOSIS — R2242 Localized swelling, mass and lump, left lower limb: Secondary | ICD-10-CM

## 2019-11-24 DIAGNOSIS — I872 Venous insufficiency (chronic) (peripheral): Secondary | ICD-10-CM

## 2019-11-24 LAB — BASIC METABOLIC PANEL
BUN/Creatinine Ratio: 12 (ref 12–28)
BUN: 10 mg/dL (ref 8–27)
CO2: 24 mmol/L (ref 20–29)
Calcium: 9.8 mg/dL (ref 8.7–10.3)
Chloride: 95 mmol/L — ABNORMAL LOW (ref 96–106)
Creatinine, Ser: 0.83 mg/dL (ref 0.57–1.00)
GFR calc Af Amer: 80 mL/min/{1.73_m2} (ref >59)
GFR calc non Af Amer: 70 mL/min/{1.73_m2} (ref >59)
Glucose: 90 mg/dL (ref 65–99)
Potassium: 3.8 mmol/L (ref 3.5–5.2)
Sodium: 135 mmol/L (ref 134–144)

## 2019-11-24 MED ORDER — METOPROLOL TARTRATE 100 MG PO TABS: ORAL_TABLET | ORAL | 0 refills | Status: DC

## 2019-11-24 NOTE — Assessment & Plan Note (Signed)
Ms. Blane complains of 6 to 12 months of isolated left lower extremity swelling.  Her right lower extremity looks normal.  Recent venous Doppler study showed no evidence of DVT.  I worry about outflow tract obstruction versus venous reflux.  I am going to get venous reflux studies and a CTA to rule out outflow tract obstruction.

## 2019-11-24 NOTE — Progress Notes (Signed)
11/24/2019 Bertram Gala   1946/08/07  [id number]  Primary Physician Rankins, Fanny Dance, MD Primary Cardiologist: Runell Gess MD Nicholes Calamity, [place]  HPI:  Tayja Manzer is a 73 y.o. mildly overweight widowed Caucasian female mother of 3, grandmother of 4 grandchildren accompanied by her daughter Lowella Bandy today.  She was referred by Dr. Cristal Deer for peripheral vascular valuation because of isolated left lower extremity swelling.  She is a former patient of Dr. York Spaniel.  Risk factors include ongoing tobacco abuse, treated hypertension and hyperlipidemia.  She is never had a heart tach or stroke.  She denies chest pain but does have COPD and does complain of some shortness of breath.  There is a questionable history of PVD with recent arterial Dopplers revealed a right ABI of 0.93 and a left of 0.91 with a high-frequency signal in her right external iliac artery although she really denies claudication.  She walks with a walker/cane.  She is had swelling of her left lower extremity for the last 6 to 12 months.  Recent venous Doppler showed no evidence of DVT.  Diuretics and compression stockings were recommended.   Current Meds  Medication Sig  . albuterol (PROVENTIL HFA;VENTOLIN HFA) 108 (90 Base) MCG/ACT inhaler Inhale 2 puffs into the lungs every 6 (six) hours as needed for wheezing or shortness of breath.  Marland Kitchen albuterol (PROVENTIL) (2.5 MG/3ML) 0.083% nebulizer solution Take 2.5 mg by nebulization every 6 (six) hours as needed for wheezing or shortness of breath.  Marland Kitchen amLODipine (NORVASC) 10 MG tablet Take 10 mg by mouth every morning.  Marland Kitchen aspirin EC 81 MG tablet Take 81 mg by mouth daily.  . bisoprolol (ZEBETA) 10 MG tablet Take 1 tablet (10 mg total) by mouth daily.  . Cyanocobalamin (VITAMIN B-12) 500 MCG SUBL Place 1,000 mcg under the tongue daily.  . fluticasone (FLONASE) 50 MCG/ACT nasal spray Place 1-2 sprays into both nostrils daily as needed for allergies or rhinitis.  .  folic acid (FOLVITE) 400 MCG tablet Take 400 mcg by mouth daily.  . hydrochlorothiazide (MICROZIDE) 12.5 MG capsule Take 12.5 mg by mouth every morning.  Marland Kitchen losartan (COZAAR) 100 MG tablet Take 100 mg by mouth every morning.  Marland Kitchen losartan-hydrochlorothiazide (HYZAAR) 100-25 MG tablet Take 1 tablet by mouth daily.  . Multiple Vitamin (MULTIVITAMIN WITH MINERALS) TABS tablet Take 1 tablet by mouth daily.  Marland Kitchen oxymetazoline (DRISTAN SPRAY) 0.05 % nasal spray Place 1 spray into both nostrils 2 (two) times daily.  . pantoprazole (PROTONIX) 40 MG tablet Take 1 tablet (40 mg total) by mouth daily.  . potassium chloride (K-DUR) 10 MEQ tablet Take 1 tablet (10 mEq total) by mouth daily.  . simvastatin (ZOCOR) 20 MG tablet Take 20 mg by mouth every evening.  . traMADol (ULTRAM) 50 MG tablet Take 50 mg by mouth every 6 (six) hours as needed for moderate pain.  . vitamin E 200 UNIT capsule Take 200 Units by mouth daily.     Allergies  Allergen Reactions  . Hydrocodone Nausea And Vomiting  . Latex Itching and Rash    Reaction to latex gloves and rubber goods    Social History   Socioeconomic History  . Marital status: Widowed    Spouse name: Not on file  . Number of children: Not on file  . Years of education: Not on file  . Highest education level: Not on file  Occupational History  . Not on file  Social Needs  . Physicist, medical  strain: Not on file  . Food insecurity    Worry: Not on file    Inability: Not on file  . Transportation needs    Medical: Not on file    Non-medical: Not on file  Tobacco Use  . Smoking status: Current Every Day Smoker    Packs/day: 0.50    Types: Cigarettes  . Smokeless tobacco: Never Used  Substance and Sexual Activity  . Alcohol use: Yes  . Drug use: No  . Sexual activity: Not on file  Lifestyle  . Physical activity    Days per week: Not on file    Minutes per session: Not on file  . Stress: Not on file  Relationships  . Social Herbalist  on phone: Not on file    Gets together: Not on file    Attends religious service: Not on file    Active member of club or organization: Not on file    Attends meetings of clubs or organizations: Not on file    Relationship status: Not on file  . Intimate partner violence    Fear of current or ex partner: Not on file    Emotionally abused: Not on file    Physically abused: Not on file    Forced sexual activity: Not on file  Other Topics Concern  . Not on file  Social History Narrative  . Not on file     Review of Systems: General: negative for chills, fever, night sweats or weight changes.  Cardiovascular: negative for chest pain, dyspnea on exertion, edema, orthopnea, palpitations, paroxysmal nocturnal dyspnea or shortness of breath Dermatological: negative for rash Respiratory: negative for cough or wheezing Urologic: negative for hematuria Abdominal: negative for nausea, vomiting, diarrhea, bright red blood per rectum, melena, or hematemesis Neurologic: negative for visual changes, syncope, or dizziness All other systems reviewed and are otherwise negative except as noted above.    Blood pressure 124/60, pulse 93, height 5\' 5"  (1.651 m), weight 116 lb 6.4 oz (52.8 kg), SpO2 98 %.  General appearance: alert and no distress Neck: no adenopathy, no carotid bruit, no JVD, supple, symmetrical, trachea midline and thyroid not enlarged, symmetric, no tenderness/mass/nodules Lungs: clear to auscultation bilaterally Heart: regular rate and rhythm, S1, S2 normal, no murmur, click, rub or gallop Extremities: Left lower extremity edema Pulses: 2+ and symmetric Skin: Skin color, texture, turgor normal. No rashes or lesions Neurologic: Alert and oriented X 3, normal strength and tone. Normal symmetric reflexes. Normal coordination and gait  EKG not performed today  ASSESSMENT AND PLAN:   Peripheral arterial disease (HCC) History of PAD with recent arterial Dopplers performed earlier  this month revealing a right ABI of 0.93 and a left of 0.91 with a high-frequency signal in the right external iliac artery although she is asymptomatic and really denies claudication.  Localized swelling of left lower extremity Ms. Govan complains of 6 to 12 months of isolated left lower extremity swelling.  Her right lower extremity looks normal.  Recent venous Doppler study showed no evidence of DVT.  I worry about outflow tract obstruction versus venous reflux.  I am going to get venous reflux studies and a CTA to rule out outflow tract obstruction.      Lorretta Harp MD FACP,FACC,FAHA, [facility] 11/24/2019 10:59 AM

## 2019-11-24 NOTE — Assessment & Plan Note (Signed)
History of PAD with recent arterial Dopplers performed earlier this month revealing a right ABI of 0.93 and a left of 0.91 with a high-frequency signal in the right external iliac artery although she is asymptomatic and really denies claudication.

## 2019-11-24 NOTE — Patient Instructions (Addendum)
Medication Instructions:  Your physician recommends that you continue on your current medications as directed. Please refer to the Current Medication list given to you today.  If you need a refill on your cardiac medications before your next appointment, please call your pharmacy.   Lab work: BMET  Testing/Procedures: Venous Reflux Study  Non-Cardiac CT scanning, (CAT scanning), is a noninvasive, special x-ray that produces cross-sectional images of the body using x-rays and a computer. CT scans help physicians diagnose and treat medical conditions. For some CT exams, a contrast material is used to enhance visibility in the area of the body being studied. CT scans provide greater clarity and reveal more details than regular x-ray exams.   Follow-Up: At [facility], you and your health needs are our priority.  As part of our continuing mission to provide you with exceptional heart care, we have created designated Provider Care Teams.  These Care Teams include your primary Cardiologist (physician) and Advanced Practice Providers (APPs -  Physician Assistants and Nurse Practitioners) who all work together to provide you with the care you need, when you need it. You may see Dr Gwenlyn Found or one of the following Advanced Practice Providers on your designated Care Team:    Kerin Ransom, PA-C  [place], [place]  Coletta Memos, FNP  Follow-up as needed.   Your cardiac CT will be scheduled at one of the below locations:   [facility] [address] [place], [place] [address] [phone]  If scheduled at [facility], please arrive at the [facility] main entrance of [facility] 30-45 minutes prior to test start time. Proceed to the [facility] Radiology Department (first floor) to check-in and test prep.   Please follow these instructions carefully (unless otherwise directed):  On the Night Before the Test: . Be sure to Drink plenty of water. . Do not  consume any caffeinated/decaffeinated beverages or chocolate 12 hours prior to your test. . Do not take any antihistamines 12 hours prior to your test.  On the Day of the Test: . Drink plenty of water. Do not drink any water within one hour of the test. . Do not eat any food 4 hours prior to the test. . You may take your regular medications prior to the test.  . Take 100mg  metoprolol (Lopressor) two hours prior to test. . HOLD Furosemide/Hydrochlorothiazide morning of the test. . FEMALES- please wear underwire-free bra if available         After the Test: . Drink plenty of water. . After receiving IV contrast, you may experience a mild flushed feeling. This is normal. . On occasion, you may experience a mild rash up to 24 hours after the test. This is not dangerous. If this occurs, you can take Benadryl 25 mg and increase your fluid intake. . If you experience trouble breathing, this can be serious. If it is severe call 911 IMMEDIATELY. If it is mild, please call our office. . If you take any of these medications: Glipizide/Metformin, Avandament, Glucavance, please do not take 48 hours after completing test unless otherwise instructed.   Once we have confirmed authorization from your insurance company, we will call you to set up a date and time for your test.   For non-scheduling related questions, please contact the cardiac imaging nurse navigator should you have any questions/concerns: Marchia Bond, RN Navigator Cardiac Imaging [facility] [phone] Office

## 2019-12-02 ENCOUNTER — Telehealth: Payer: Self-pay | Admitting: Cardiovascular Disease

## 2019-12-02 NOTE — Telephone Encounter (Signed)
Instructed to take Metoprolol prior CT ./cy

## 2019-12-02 NOTE — Telephone Encounter (Signed)
New Message  Pt's daughter is calling and stated that her mom is taking Metoprol 100 mg and wants to know which test this medication should be taken 2 hr before the test  Please advise

## 2019-12-03 ENCOUNTER — Other Ambulatory Visit (HOSPITAL_COMMUNITY): Payer: Medicare Other

## 2019-12-03 ENCOUNTER — Ambulatory Visit (HOSPITAL_COMMUNITY)
Admission: RE | Admit: 2019-12-03 | Discharge: 2019-12-03 | Disposition: A | Discharge status: Home / Self Care | Payer: Medicare Other | Source: Ambulatory Visit | Attending: Cardiovascular Disease | Admitting: Cardiovascular Disease

## 2019-12-03 ENCOUNTER — Other Ambulatory Visit: Payer: Self-pay

## 2019-12-03 DIAGNOSIS — I872 Venous insufficiency (chronic) (peripheral): Secondary | ICD-10-CM

## 2019-12-03 DIAGNOSIS — I824Y9 Acute embolism and thrombosis of unspecified deep veins of unspecified proximal lower extremity: Secondary | ICD-10-CM | POA: Diagnosis not present

## 2019-12-03 DIAGNOSIS — I739 Peripheral vascular disease, unspecified: Secondary | ICD-10-CM | POA: Diagnosis not present

## 2019-12-03 DIAGNOSIS — I824Y2 Acute embolism and thrombosis of unspecified deep veins of left proximal lower extremity: Secondary | ICD-10-CM | POA: Diagnosis not present

## 2019-12-03 MED ORDER — IOHEXOL 350 MG/ML SOLN
100.0000 mL | Freq: Once | INTRAVENOUS | Status: AC | PRN
  Administered 2019-12-03: 100 mL via INTRAVENOUS

## 2019-12-30 DIAGNOSIS — J449 Chronic obstructive pulmonary disease, unspecified: Secondary | ICD-10-CM | POA: Diagnosis not present

## 2019-12-30 DIAGNOSIS — E7800 Pure hypercholesterolemia, unspecified: Secondary | ICD-10-CM | POA: Diagnosis not present

## 2019-12-30 DIAGNOSIS — E785 Hyperlipidemia, unspecified: Secondary | ICD-10-CM | POA: Diagnosis not present

## 2019-12-30 DIAGNOSIS — M810 Age-related osteoporosis without current pathological fracture: Secondary | ICD-10-CM | POA: Diagnosis not present

## 2019-12-30 DIAGNOSIS — I10 Essential (primary) hypertension: Secondary | ICD-10-CM | POA: Diagnosis not present

## 2020-01-03 ENCOUNTER — Ambulatory Visit: Payer: Medicare Other | Admitting: Cardiology

## 2020-01-03 ENCOUNTER — Other Ambulatory Visit: Payer: Self-pay

## 2020-01-03 ENCOUNTER — Encounter: Payer: Self-pay | Admitting: Cardiology

## 2020-01-03 VITALS — BP 148/70 | HR 101 | Ht 65.0 in | Wt 117.0 lb

## 2020-01-03 DIAGNOSIS — Z716 Tobacco abuse counseling: Secondary | ICD-10-CM | POA: Diagnosis not present

## 2020-01-03 DIAGNOSIS — I10 Essential (primary) hypertension: Secondary | ICD-10-CM | POA: Diagnosis not present

## 2020-01-03 DIAGNOSIS — I739 Peripheral vascular disease, unspecified: Secondary | ICD-10-CM

## 2020-01-03 DIAGNOSIS — M7989 Other specified soft tissue disorders: Secondary | ICD-10-CM

## 2020-01-03 DIAGNOSIS — E782 Mixed hyperlipidemia: Secondary | ICD-10-CM

## 2020-01-03 DIAGNOSIS — F17210 Nicotine dependence, cigarettes, uncomplicated: Secondary | ICD-10-CM

## 2020-01-03 MED ORDER — ROSUVASTATIN CALCIUM 20 MG PO TABS: 20.0000 mg | ORAL_TABLET | Freq: Every day | ORAL | 3 refills | Status: DC

## 2020-01-03 NOTE — Progress Notes (Signed)
Cardiology Office Note:    Date:  01/03/2020   ID:  Erin Calderon, DOB 1946-08-02, MRN [id number]  PCP:  Aretta Nip, MD  Cardiologist:  Buford Dresser, MD (prior Dr. Wynonia Lawman)  Referring MD: Eloise Levels, NP   CC: follow up  History of Present Illness:    Erin Calderon is a 74 y.o. female with a hx of tobacco use, hypertension, peripheral vascular disease, hyperlipidemia, COPD who is seen in follow up today. I initially saw her as a new patient to me on 11/16/19. She was last seen by Dr. Wynonia Lawman on 06/18/2018. She is also followed by Dr. Gwenlyn Found for PAD.  Summary of notes from Dr. Thurman Coyer reports: Echo 03/16/2018 EF 65%, moderate cLVH, grade 1 diastolic dysfunction. Mild-moderate TR, mild-moderate PR. No documented stress/cath  Today: Left leg remains very swollen. Breathing is at her baseline, not great but not worsen. Still smoking, discussed cessation again today. Blood pressures at home are better than here in the office, 114/70 recently.   I reviewed results of CT scan and ultrasounds with her. Spent significant time talking about cholesterol/plaque and calcium. Discussed symptoms that can be related to this. Does endorse early satiety though no postprandial pain. No melena or hematochezia. Does endorse "bubbles" in her stool when she has a BM.   We discussed that thus far, there is no clear main cause of her LLE edema. Venous reflux is normal, and though there is PAD, it is not severe enough to clearly account for her swelling. No DVT found. Discussed that this may be capillary/lymph related edema, as it is pitting and responds to compression. Discussed management options.   Denies chest pain, shortness of breath at rest or with normal exertion. No PND, orthopnea, or unexpected weight gain. No syncope or palpitations.  Past Medical History:  Diagnosis Date  . Hypertension     No past surgical history on file.  Current Medications: Current Outpatient Medications  on File Prior to Visit  Medication Sig  . albuterol (PROVENTIL HFA;VENTOLIN HFA) 108 (90 Base) MCG/ACT inhaler Inhale 2 puffs into the lungs every 6 (six) hours as needed for wheezing or shortness of breath.  Marland Kitchen albuterol (PROVENTIL) (2.5 MG/3ML) 0.083% nebulizer solution Take 2.5 mg by nebulization every 6 (six) hours as needed for wheezing or shortness of breath.  Marland Kitchen amLODipine (NORVASC) 10 MG tablet Take 10 mg by mouth every morning.  Marland Kitchen aspirin EC 81 MG tablet Take 81 mg by mouth daily.  . bisoprolol (ZEBETA) 10 MG tablet Take 1 tablet (10 mg total) by mouth daily.  . Cyanocobalamin (VITAMIN B-12) 500 MCG SUBL Place 1,000 mcg under the tongue daily.  . fluticasone (FLONASE) 50 MCG/ACT nasal spray Place 1-2 sprays into both nostrils daily as needed for allergies or rhinitis.  . folic acid (FOLVITE) 944 MCG tablet Take 400 mcg by mouth daily.  . hydrochlorothiazide (MICROZIDE) 12.5 MG capsule Take 12.5 mg by mouth every morning.  Marland Kitchen losartan (COZAAR) 100 MG tablet Take 100 mg by mouth every morning.  . metoprolol tartrate (LOPRESSOR) 100 MG tablet Take 2 hours prior to test.  . Multiple Vitamin (MULTIVITAMIN WITH MINERALS) TABS tablet Take 1 tablet by mouth daily.  Marland Kitchen oxymetazoline (DRISTAN SPRAY) 0.05 % nasal spray Place 1 spray into both nostrils 2 (two) times daily.  . pantoprazole (PROTONIX) 40 MG tablet Take 1 tablet (40 mg total) by mouth daily.  . potassium chloride (K-DUR) 10 MEQ tablet Take 1 tablet (10 mEq total) by mouth daily.  Marland Kitchen  traMADol (ULTRAM) 50 MG tablet Take 50 mg by mouth every 6 (six) hours as needed for moderate pain.  . vitamin E 200 UNIT capsule Take 200 Units by mouth daily.   No current facility-administered medications on file prior to visit.     Allergies:   Hydrocodone and Latex   Social History   Tobacco Use  . Smoking status: Current Every Day Smoker    Packs/day: 0.50    Types: Cigarettes  . Smokeless tobacco: Never Used  Substance Use Topics  . Alcohol  use: Yes  . Drug use: No    Family History: Father deceased, natural causes. Mother deceased, dementia. Sister deceased, CVA/dementia. Sister deceased, cancer.  ROS:   Please see the history of present illness.  Additional pertinent ROS: Constitutional: Negative for chills, fever, night sweats. Positive for prior unintentional weight loss, now stabilized. HENT: Negative for ear pain and hearing loss.   Eyes: Negative for loss of vision and eye pain.  Respiratory: Negative for cough, sputum, wheezing.   Cardiovascular: See HPI. Gastrointestinal: Negative for abdominal pain, melena, and hematochezia.  Genitourinary: Negative for dysuria and hematuria.  Musculoskeletal: Negative for falls and myalgias.  Skin: Negative for itching and rash.  Neurological: Negative for focal weakness, focal sensory changes and loss of consciousness.  Endo/Heme/Allergies: Does bruise/bleed easily.   EKGs/Labs/Other Studies Reviewed:    The following studies were reviewed today: Notes from Dr. Donnie Aho Vascular studies, including venous duplex, venous reflux, and ABIs. CT angio with runoff 12/04/19 FINDINGS: VASCULAR  Coronary calcifications.  Aorta: Heavily calcified atheromatous plaque. Mild tortuosity. No aneurysm, dissection, or stenosis.  Celiac: Calcified ostial plaque resulting in short segment stenosis of possible hemodynamic significance, patent distally.  SMA: Calcified ostial plaque resulting in short segment stenosis of possible hemodynamic significance, patent distally. Classic distal branch anatomy.  Renals: Single bilaterally, both with heavily calcified ostial plaque resulting in short segment stenosis of possible hemodynamic significance.  IMA: Limited evaluation on this venous phase examination  RIGHT Lower Extremity  Inflow: Heavily calcified plaque throughout the iliac arterial system resulting in tandem regions of at least mild stenosis.  Outflow: Deep femoral  branches atheromatous but patent. Diffuse calcified plaque through the SFA and popliteal artery which remain patent.  Runoff: Heavy calcifications in the visualized proximal tibial runoff, not evaluated distally.  LEFT Lower Extremity  Inflow: Heavily calcified plaque through the iliac arterial system.  Outflow: Calcified plaque through the SFA and popliteal artery which remain patent.  Runoff: Proximal anterior tibial artery and tibioperoneal trunk patent. Not evaluated distally  VEINS  Normal appearance of bilateral common femoral veins, iliac venous system, IVC.  Renal veins patent bilaterally.  Diminutive but patent splenic vein. Patent SMV, portal venous system, and hepatic veins.  No venous pathology is identified.  Review of the MIP images confirms the above findings.  NON-VASCULAR  Lower chest: No pleural or pericardial effusion.  Hepatobiliary: Chronic atrophy in the medial segment left hepatic lobe. No focal liver lesion or biliary ductal dilatation. Subcentimeter partially calcified layering calculi in the dependent aspect of the nondilated gallbladder.  Pancreas: Unremarkable. No pancreatic ductal dilatation or surrounding inflammatory changes.  Spleen: Diminutive, without focal lesion.  Adrenals/Urinary Tract: Normal adrenal glands. No hydronephrosis or evident urolithiasis. Urinary bladder incompletely distended.  Stomach/Bowel: Stomach is nondistended. Small bowel nondilated. Colon is nondistended, unremarkable.  Lymphatic: No abdominal or pelvic adenopathy.  Reproductive: Status post hysterectomy. No adnexal masses.  Other: No ascites. Bilateral pelvic phleboliths. No free air.  Musculoskeletal: Transitional  lumbosacral segment assigned S1. 5 non rib-bearing lumbar type vertebral bodies. Chronic compression deformity T12.  L1 compression fracture with some gas between fracture fragments, approximately 30% loss of  height anteriorly, no significant retropulsion or posterior element involvement.  L2 compression fracture with some gas in the fracture, approximately 20% loss of height anteriorly, no significant retropulsion or posterior element involvement. Spondylitic changes T11-L4.  IMPRESSION: 1. No evidence of iliocaval thrombus, compressive pathology, or venous occlusion. 2. L1 and L2 vertebral compression fractures, possibly subacute. Chronic T12 compression deformity. 3. Bilateral lower extremity inflow and outflow arterial occlusive disease of indeterminate hemodynamic significance. Correlate with clinical symptomatology and ABIs. 4. Renal and visceral arterial origin stenoses, of possible hemodynamic significance, not well evaluated on this venous phase study. 5. Cholelithiasis  EKG:  EKG is personally reviewed.  The ekg ordered 11/23/19 demonstrates SR with PACs, poor R wave progression  Recent Labs: 11/24/2019: BUN 10; Creatinine, Ser 0.83; Potassium 3.8; Sodium 135  Recent Lipid Panel No results found for: CHOL, TRIG, HDL, CHOLHDL, VLDL, LDLCALC, LDLDIRECT  Physical Exam:    VS:  BP (!) 148/70   Pulse (!) 101   Ht 5\' 5"  (1.651 m)   Wt 117 lb (53.1 kg)   BMI 19.47 kg/m     Wt Readings from Last 3 Encounters:  01/03/20 117 lb (53.1 kg)  11/24/19 116 lb 6.4 oz (52.8 kg)  11/16/19 116 lb (52.6 kg)    GEN: Well nourished, well developed in no acute distress HEENT: Normal, moist mucous membranes NECK: No JVD CARDIAC: regular rhythm, normal S1 and S2, no rubs or gallops. 1/6 SM murmur. VASCULAR: Radial pulses 2+ bilaterally. No carotid bruits RESPIRATORY:  Clear to auscultation without rales, wheezing or rhonchi  ABDOMEN: Soft, non-tender, non-distended MUSCULOSKELETAL:  Ambulates independently SKIN: Warm and dry. No edema on RLE. Left lower extremity with pitting edema to knee, significantly more enlarged than right leg. NEUROLOGIC:  Alert and oriented x 3. No focal neuro  deficits noted. PSYCHIATRIC:  Normal affect   ASSESSMENT:    1. Peripheral arterial disease (HCC)   2. Hypertension, unspecified type   3. Cigarette smoker   4. Calf swelling   5. Tobacco abuse counseling   6. Mixed hyperlipidemia    PLAN:    Calf swelling: has now been present for ~1 year. Unilateral, pitting.  -DVT study negative -ABI 0.93 on R and 0.91 on left. -venous reflex studies unremarkable -CT angio with runoff with diffuse PAD but no clear hemodynamically significant obstructions in LE -recommended continued compression (can increase compression strength as tolerated, instructed on watching for too much compression given mild PAD)  Arterial calcification, diffuse, with PAD -counseled extensively on cholesterol/plaque formation and what calcium notes -encouraged tobacco cessation, below -discussed intensifying her lipid regimen for hyperlipidemia. She is amenable to this. Changing simvastatin to rosuvastatin. -continue aspirin 81 mg  Hypertension: has been elevated in office, but reports good control at home -continue current HCTZ, losartan, bisoprolol for now  Tobacco use, with counseling: The patient was counseled on tobacco cessation today for 4 minutes.  Counseling included reviewing the risks of smoking tobacco products, how it impacts the patient's current medical diagnoses and different strategies for quitting.  Pharmacotherapy to aid in tobacco cessation was not prescribed today. With her known PAD, this was stressed strongly and discussed at length. The calcification and plaque seen on CT is diffuse and concerning, and quitting smoking is paramount to preventing progression of this.  Cardiac risk counseling and prevention recommendations: -  recommend heart healthy/Mediterranean diet, with whole grains, fruits, vegetable, fish, lean meats, nuts, and olive oil. Limit salt. -recommend moderate walking, 3-5 times/week for 30-50 minutes each session. Aim for at least  150 minutes.week. Goal should be pace of 3 miles/hours, or walking 1.5 miles in 30 minutes -recommend avoidance of tobacco products. Avoid excess alcohol. -continue aspirin, simvastatin for secondary prevention (PVD)  Plan for follow up: 67mos or sooner PRN  TIME SPENT WITH PATIENT: 35 minutes of direct patient care. More than 50% of that time was spent on coordination of care and counseling regarding test results, secondary prevention and management recommendations.Jodelle Red, MD, PhD [place]  [facility]   Medication Adjustments/Labs and Tests Ordered: Current medicines are reviewed at length with the patient today.  Concerns regarding medicines are outlined above.   No orders of the defined types were placed in this encounter.  Meds ordered this encounter  Medications  . rosuvastatin (CRESTOR) 20 MG tablet    Sig: Take 1 tablet (20 mg total) by mouth daily.    Dispense:  90 tablet    Refill:  3    Patient Instructions  Medication Instructions:  Stop: Simvastatin 20 mg daily Start: Rosuvastatin 20 mg daily  *If you need a refill on your cardiac medications before your next appointment, please call your pharmacy*  Lab Work: None  Testing/Procedures: None  Follow-Up: At [facility], you and your health needs are our priority.  As part of our continuing mission to provide you with exceptional heart care, we have created designated Provider Care Teams.  These Care Teams include your primary Cardiologist (physician) and Advanced Practice Providers (APPs -  Physician Assistants and Nurse Practitioners) who all work together to provide you with the care you need, when you need it.  Your next appointment:   6 month(s)  The format for your next appointment:   In Person  Provider:   Jodelle Red, MD      Signed, Jodelle Red, MD PhD 01/03/2020 1:41 PM    [facility]

## 2020-01-03 NOTE — Patient Instructions (Signed)
Medication Instructions:  Stop: Simvastatin 20 mg daily Start: Rosuvastatin 20 mg daily  *If you need a refill on your cardiac medications before your next appointment, please call your pharmacy*  Lab Work: None  Testing/Procedures: None  Follow-Up: At [facility], you and your health needs are our priority.  As part of our continuing mission to provide you with exceptional heart care, we have created designated Provider Care Teams.  These Care Teams include your primary Cardiologist (physician) and Advanced Practice Providers (APPs -  Physician Assistants and Nurse Practitioners) who all work together to provide you with the care you need, when you need it.  Your next appointment:   6 month(s)  The format for your next appointment:   In Person  Provider:   Jodelle Red, MD

## 2020-01-20 DIAGNOSIS — J449 Chronic obstructive pulmonary disease, unspecified: Secondary | ICD-10-CM | POA: Diagnosis not present

## 2020-01-20 DIAGNOSIS — M810 Age-related osteoporosis without current pathological fracture: Secondary | ICD-10-CM | POA: Diagnosis not present

## 2020-01-20 DIAGNOSIS — E7841 Elevated Lipoprotein(a): Secondary | ICD-10-CM | POA: Diagnosis not present

## 2020-01-20 DIAGNOSIS — E7800 Pure hypercholesterolemia, unspecified: Secondary | ICD-10-CM | POA: Diagnosis not present

## 2020-01-20 DIAGNOSIS — I10 Essential (primary) hypertension: Secondary | ICD-10-CM | POA: Diagnosis not present

## 2020-02-09 DIAGNOSIS — I10 Essential (primary) hypertension: Secondary | ICD-10-CM | POA: Diagnosis not present

## 2020-02-09 DIAGNOSIS — E7849 Other hyperlipidemia: Secondary | ICD-10-CM | POA: Diagnosis not present

## 2020-02-09 DIAGNOSIS — M810 Age-related osteoporosis without current pathological fracture: Secondary | ICD-10-CM | POA: Diagnosis not present

## 2020-02-09 DIAGNOSIS — E7800 Pure hypercholesterolemia, unspecified: Secondary | ICD-10-CM | POA: Diagnosis not present

## 2020-03-27 DIAGNOSIS — I10 Essential (primary) hypertension: Secondary | ICD-10-CM | POA: Diagnosis not present

## 2020-03-27 DIAGNOSIS — E7800 Pure hypercholesterolemia, unspecified: Secondary | ICD-10-CM | POA: Diagnosis not present

## 2020-03-27 DIAGNOSIS — E7849 Other hyperlipidemia: Secondary | ICD-10-CM | POA: Diagnosis not present

## 2020-03-27 DIAGNOSIS — M810 Age-related osteoporosis without current pathological fracture: Secondary | ICD-10-CM | POA: Diagnosis not present

## 2020-05-11 DIAGNOSIS — E7800 Pure hypercholesterolemia, unspecified: Secondary | ICD-10-CM | POA: Diagnosis not present

## 2020-05-11 DIAGNOSIS — I10 Essential (primary) hypertension: Secondary | ICD-10-CM | POA: Diagnosis not present

## 2020-05-11 DIAGNOSIS — M810 Age-related osteoporosis without current pathological fracture: Secondary | ICD-10-CM | POA: Diagnosis not present

## 2020-05-11 DIAGNOSIS — J449 Chronic obstructive pulmonary disease, unspecified: Secondary | ICD-10-CM | POA: Diagnosis not present

## 2020-07-13 DIAGNOSIS — I10 Essential (primary) hypertension: Secondary | ICD-10-CM | POA: Diagnosis not present

## 2020-07-13 DIAGNOSIS — E7800 Pure hypercholesterolemia, unspecified: Secondary | ICD-10-CM | POA: Diagnosis not present

## 2020-07-13 DIAGNOSIS — E785 Hyperlipidemia, unspecified: Secondary | ICD-10-CM | POA: Diagnosis not present

## 2020-07-13 DIAGNOSIS — J449 Chronic obstructive pulmonary disease, unspecified: Secondary | ICD-10-CM | POA: Diagnosis not present

## 2020-07-13 DIAGNOSIS — M810 Age-related osteoporosis without current pathological fracture: Secondary | ICD-10-CM | POA: Diagnosis not present

## 2020-07-17 DIAGNOSIS — R197 Diarrhea, unspecified: Secondary | ICD-10-CM | POA: Diagnosis not present

## 2020-07-20 ENCOUNTER — Ambulatory Visit: Payer: Medicare Other | Admitting: Cardiology

## 2020-08-09 DIAGNOSIS — K219 Gastro-esophageal reflux disease without esophagitis: Secondary | ICD-10-CM | POA: Diagnosis not present

## 2020-08-09 DIAGNOSIS — I10 Essential (primary) hypertension: Secondary | ICD-10-CM | POA: Diagnosis not present

## 2020-08-09 DIAGNOSIS — M810 Age-related osteoporosis without current pathological fracture: Secondary | ICD-10-CM | POA: Diagnosis not present

## 2020-08-09 DIAGNOSIS — E785 Hyperlipidemia, unspecified: Secondary | ICD-10-CM | POA: Diagnosis not present

## 2020-08-09 DIAGNOSIS — G8929 Other chronic pain: Secondary | ICD-10-CM | POA: Diagnosis not present

## 2020-08-09 DIAGNOSIS — J449 Chronic obstructive pulmonary disease, unspecified: Secondary | ICD-10-CM | POA: Diagnosis not present

## 2020-08-21 ENCOUNTER — Inpatient Hospital Stay (HOSPITAL_COMMUNITY)
Admission: EM | Admit: 2020-08-21 | Discharge: 2020-08-25 | DRG: 392 | Disposition: A | Discharge status: Home / Self Care | Payer: Medicare Other | Attending: Internal Medicine | Admitting: Internal Medicine

## 2020-08-21 ENCOUNTER — Emergency Department (HOSPITAL_COMMUNITY): Payer: Medicare Other

## 2020-08-21 DIAGNOSIS — I4891 Unspecified atrial fibrillation: Secondary | ICD-10-CM | POA: Diagnosis not present

## 2020-08-21 DIAGNOSIS — Z743 Need for continuous supervision: Secondary | ICD-10-CM | POA: Diagnosis not present

## 2020-08-21 DIAGNOSIS — R531 Weakness: Secondary | ICD-10-CM | POA: Diagnosis not present

## 2020-08-21 DIAGNOSIS — N179 Acute kidney failure, unspecified: Secondary | ICD-10-CM | POA: Diagnosis present

## 2020-08-21 DIAGNOSIS — K922 Gastrointestinal hemorrhage, unspecified: Secondary | ICD-10-CM

## 2020-08-21 DIAGNOSIS — Z91040 Latex allergy status: Secondary | ICD-10-CM

## 2020-08-21 DIAGNOSIS — K529 Noninfective gastroenteritis and colitis, unspecified: Principal | ICD-10-CM | POA: Diagnosis present

## 2020-08-21 DIAGNOSIS — Z66 Do not resuscitate: Secondary | ICD-10-CM | POA: Diagnosis not present

## 2020-08-21 DIAGNOSIS — R000 Tachycardia, unspecified: Secondary | ICD-10-CM | POA: Diagnosis not present

## 2020-08-21 DIAGNOSIS — E785 Hyperlipidemia, unspecified: Secondary | ICD-10-CM | POA: Diagnosis present

## 2020-08-21 DIAGNOSIS — I480 Paroxysmal atrial fibrillation: Secondary | ICD-10-CM | POA: Diagnosis not present

## 2020-08-21 DIAGNOSIS — I10 Essential (primary) hypertension: Secondary | ICD-10-CM | POA: Diagnosis not present

## 2020-08-21 DIAGNOSIS — Z20822 Contact with and (suspected) exposure to covid-19: Secondary | ICD-10-CM | POA: Diagnosis present

## 2020-08-21 DIAGNOSIS — K8070 Calculus of gallbladder and bile duct without cholecystitis without obstruction: Secondary | ICD-10-CM | POA: Diagnosis present

## 2020-08-21 DIAGNOSIS — Z681 Body mass index (BMI) 19 or less, adult: Secondary | ICD-10-CM

## 2020-08-21 DIAGNOSIS — R197 Diarrhea, unspecified: Secondary | ICD-10-CM

## 2020-08-21 DIAGNOSIS — K8020 Calculus of gallbladder without cholecystitis without obstruction: Secondary | ICD-10-CM

## 2020-08-21 DIAGNOSIS — R636 Underweight: Secondary | ICD-10-CM | POA: Diagnosis present

## 2020-08-21 DIAGNOSIS — R6510 Systemic inflammatory response syndrome (SIRS) of non-infectious origin without acute organ dysfunction: Secondary | ICD-10-CM | POA: Diagnosis present

## 2020-08-21 DIAGNOSIS — F1010 Alcohol abuse, uncomplicated: Secondary | ICD-10-CM | POA: Diagnosis present

## 2020-08-21 DIAGNOSIS — I739 Peripheral vascular disease, unspecified: Secondary | ICD-10-CM | POA: Diagnosis not present

## 2020-08-21 DIAGNOSIS — E872 Acidosis: Secondary | ICD-10-CM | POA: Diagnosis not present

## 2020-08-21 DIAGNOSIS — D62 Acute posthemorrhagic anemia: Secondary | ICD-10-CM | POA: Diagnosis present

## 2020-08-21 DIAGNOSIS — R6889 Other general symptoms and signs: Secondary | ICD-10-CM | POA: Diagnosis not present

## 2020-08-21 DIAGNOSIS — R112 Nausea with vomiting, unspecified: Secondary | ICD-10-CM

## 2020-08-21 DIAGNOSIS — I361 Nonrheumatic tricuspid (valve) insufficiency: Secondary | ICD-10-CM | POA: Diagnosis not present

## 2020-08-21 DIAGNOSIS — J449 Chronic obstructive pulmonary disease, unspecified: Secondary | ICD-10-CM | POA: Diagnosis not present

## 2020-08-21 DIAGNOSIS — E860 Dehydration: Secondary | ICD-10-CM | POA: Diagnosis present

## 2020-08-21 DIAGNOSIS — Z885 Allergy status to narcotic agent status: Secondary | ICD-10-CM | POA: Diagnosis not present

## 2020-08-21 DIAGNOSIS — F17210 Nicotine dependence, cigarettes, uncomplicated: Secondary | ICD-10-CM | POA: Diagnosis present

## 2020-08-21 DIAGNOSIS — Z7982 Long term (current) use of aspirin: Secondary | ICD-10-CM

## 2020-08-21 DIAGNOSIS — R0902 Hypoxemia: Secondary | ICD-10-CM | POA: Diagnosis not present

## 2020-08-21 DIAGNOSIS — E871 Hypo-osmolality and hyponatremia: Secondary | ICD-10-CM | POA: Diagnosis not present

## 2020-08-21 DIAGNOSIS — E876 Hypokalemia: Secondary | ICD-10-CM | POA: Diagnosis present

## 2020-08-21 DIAGNOSIS — Z79899 Other long term (current) drug therapy: Secondary | ICD-10-CM

## 2020-08-21 DIAGNOSIS — I499 Cardiac arrhythmia, unspecified: Secondary | ICD-10-CM | POA: Diagnosis not present

## 2020-08-21 DIAGNOSIS — K7030 Alcoholic cirrhosis of liver without ascites: Secondary | ICD-10-CM | POA: Diagnosis present

## 2020-08-21 LAB — PROTIME-INR
INR: 0.9 (ref 0.8–1.2)
Prothrombin Time: 12.1 seconds (ref 11.4–15.2)

## 2020-08-21 LAB — POC OCCULT BLOOD, ED: Fecal Occult Bld: POSITIVE — AB

## 2020-08-21 MED ORDER — SODIUM CHLORIDE 0.9 % IV BOLUS
500.0000 mL | Freq: Once | INTRAVENOUS | Status: AC
  Administered 2020-08-21: 500 mL via INTRAVENOUS

## 2020-08-21 NOTE — ED Triage Notes (Signed)
BIB [facility] pt c/o dirrhea, loss of appetite and nausea x 5 days. Pt reports to EMS that they have had blood in stool x 2 days. Pt has hx of afib. Pt not sure if they are on blood thinners.

## 2020-08-21 NOTE — ED Provider Notes (Signed)
Emergency Department Provider Note   I have reviewed the triage vital signs and the nursing notes.   HISTORY  Chief Complaint No chief complaint on file.   HPI Erin Calderon is a 74 y.o. female with PMH of HTN, A-fib, PAD, and COPD presents to the ED with BRBPR and mild abdominal pain.  The patient has had diarrhea with loss of appetite and nausea over the past 7 to 10 days.  She noticed bright red blood per rectum several times with bowel movements along with some mild, diffuse abdominal discomfort.  She denies any fevers or chills.  No upper respiratory infection symptoms.  Patient continues to have diarrhea with some generalized weakness.  She tells me that she has not seen blood in the past couple of days but that her children were increasingly concerned and ultimately convinced her to come to the emergency department.  She does not follow with gastroenterology and cannot recall if she has ever had a colonoscopy.  She can also not recall she is on anticoagulation for her atrial fibrillation but none is listed in the Epic chart.   Past Medical History:  Diagnosis Date  . Hypertension     Patient Active Problem List   Diagnosis Date Noted  . Bloody diarrhea 08/22/2020  . Nausea and vomiting 08/22/2020  . SIRS (systemic inflammatory response syndrome) (HCC) 08/22/2020  . Acute blood loss anemia 08/22/2020  . AKI (acute kidney injury) (HCC) 08/22/2020  . Localized swelling of left lower extremity 11/24/2019  . Peripheral arterial disease (HCC) 11/24/2019  . Essential hypertension 12/04/2016  . Cigarette smoker 12/04/2016  . COPD GOLD III/ still smoking  12/03/2016  . Upper airway cough syndrome 12/03/2016    No past surgical history on file.  Allergies Hydrocodone and Latex  History reviewed. No pertinent family history.  Social History Social History   Tobacco Use  . Smoking status: Current Every Day Smoker    Packs/day: 0.50    Types: Cigarettes  . Smokeless  tobacco: Never Used  Substance Use Topics  . Alcohol use: Yes  . Drug use: No    Review of Systems  Constitutional: No fever/chills. Positive weakness.  Eyes: No visual changes. ENT: No sore throat. Cardiovascular: Denies chest pain. Respiratory: Denies shortness of breath. Gastrointestinal: Mild abdominal pain. Positive nausea, no vomiting. Positive diarrhea with BRBPR.  No constipation. Genitourinary: Negative for dysuria. Musculoskeletal: Negative for back pain. Skin: Negative for rash. Neurological: Negative for headaches, focal weakness or numbness.  10-point ROS otherwise negative.  ____________________________________________   PHYSICAL EXAM:  VITAL SIGNS: ED Triage Vitals [08/21/20 2106]  Enc Vitals Group     BP 94/64     Pulse Rate (!) 112     Resp (!) 21     Temp 98.4 F (36.9 C)     Temp Source Oral     SpO2 99 %     Weight 110 lb (49.9 kg)     Height  (1.651 m)   Constitutional: Alert and oriented. Well appearing and in no acute distress. Eyes: Conjunctivae are normal.  Head: Atraumatic. Nose: No congestion/rhinnorhea. Mouth/Throat: Mucous membranes are moist.   Neck: No stridor.   Cardiovascular: Tachycardia. Good peripheral circulation. Grossly normal heart sounds.   Respiratory: Normal respiratory effort.  No retractions. Lungs CTAB. Gastrointestinal: Soft with mild LLQ tenderness. No rebound or guarding. No distention. Rectal exam performed with nurse tech chaperone and w/ patient's verbal consent. No BRB or melena. No external findings of note.  Hemoccult positive.  Musculoskeletal: No gross deformities of extremities. Neurologic:  Normal speech and language. No gross focal neurologic deficits are appreciated.  Skin:  Skin is warm, dry and intact. No rash noted.  ____________________________________________   LABS (all labs ordered are listed, but only abnormal results are displayed)  Labs Reviewed  COMPREHENSIVE METABOLIC PANEL -  Abnormal; Notable for the following components:      Result Value   Sodium 129 (*)    Chloride 90 (*)    CO2 20 (*)    BUN 46 (*)    Creatinine, Ser 4.15 (*)    Calcium 7.4 (*)    Total Protein 5.9 (*)    Albumin 3.1 (*)    AST 169 (*)    ALT 96 (*)    Total Bilirubin 1.5 (*)    GFR calc non Af Amer 10 (*)    GFR calc Af Amer 11 (*)    Anion gap 19 (*)    All other components within normal limits  LIPASE, BLOOD - Abnormal; Notable for the following components:   Lipase 56 (*)    All other components within normal limits  CBC WITH DIFFERENTIAL/PLATELET - Abnormal; Notable for the following components:   WBC 16.4 (*)    RBC 3.04 (*)    Hemoglobin 10.7 (*)    HCT 30.9 (*)    MCV 101.6 (*)    MCH 35.2 (*)    Neutro Abs 13.2 (*)    Lymphs Abs 0.6 (*)    Monocytes Absolute 1.1 (*)    Eosinophils Absolute 1.3 (*)    Abs Immature Granulocytes 0.12 (*)    All other components within normal limits  MAGNESIUM - Abnormal; Notable for the following components:   Magnesium 1.3 (*)    All other components within normal limits  VITAMIN B12 - Abnormal; Notable for the following components:   Vitamin B-12 947 (*)    All other components within normal limits  IRON AND TIBC - Abnormal; Notable for the following components:   TIBC 223 (*)    All other components within normal limits  RETICULOCYTES - Abnormal; Notable for the following components:   RBC. 2.97 (*)    Immature Retic Fract 27.1 (*)    All other components within normal limits  CBC - Abnormal; Notable for the following components:   WBC 15.1 (*)    RBC 2.83 (*)    Hemoglobin 10.0 (*)    HCT 29.2 (*)    MCV 103.2 (*)    MCH 35.3 (*)    All other components within normal limits  COMPREHENSIVE METABOLIC PANEL - Abnormal; Notable for the following components:   Sodium 130 (*)    Potassium 2.5 (*)    Chloride 94 (*)    CO2 20 (*)    BUN 40 (*)    Creatinine, Ser 3.27 (*)    Calcium 7.4 (*)    Total Protein 5.6 (*)     Albumin 2.7 (*)    AST 121 (*)    ALT 86 (*)    GFR calc non Af Amer 13 (*)    GFR calc Af Amer 15 (*)    Anion gap 16 (*)    All other components within normal limits  POC OCCULT BLOOD, ED - Abnormal; Notable for the following components:   Fecal Occult Bld POSITIVE (*)    All other components within normal limits  SARS CORONAVIRUS 2 BY RT PCR (HOSPITAL ORDER, PERFORMED IN  [facility])  URINE CULTURE  CULTURE, BLOOD (ROUTINE X 2)  CULTURE, BLOOD (ROUTINE X 2)  C DIFFICILE QUICK SCREEN W PCR REFLEX  GASTROINTESTINAL PANEL BY PCR, STOOL (REPLACES STOOL CULTURE)  PROTIME-INR  FERRITIN  LACTIC ACID, PLASMA  PROCALCITONIN  FOLATE  URINALYSIS, ROUTINE W REFLEX MICROSCOPIC  SODIUM, URINE, RANDOM  CREATININE, URINE, RANDOM  BASIC METABOLIC PANEL  MAGNESIUM  TSH  TYPE AND SCREEN  ABO/RH   ____________________________________________  EKG   EKG Interpretation  Date/Time:  Monday August 21 2020 21:09:35 EDT Ventricular Rate:  120 PR Interval:    QRS Duration: 76 QT Interval:  362 QTC Calculation: 512 R Axis:   90 Text Interpretation: Atrial fibrillation Ventricular premature complex Anterior infarct, age indeterminate Prolonged QT interval Baseline wander in lead(s) V1 No STEMI Confirmed by Alona BeneLong, Edrie Ehrich [phone]([id number]) on 08/21/2020 9:11:44 PM       ____________________________________________  RADIOLOGY  CT ABDOMEN PELVIS WO CONTRAST  Result Date: 08/22/2020 CLINICAL DATA:  Loss of appetite, nausea, diarrhea for 5 days. History of abdominal pain. Bloody stools for 2 days. Suspected infectious gastroenteritis or colitis. EXAM: CT ABDOMEN AND PELVIS WITHOUT CONTRAST TECHNIQUE: Multidetector CT imaging of the abdomen and pelvis was performed following the standard protocol without IV contrast. COMPARISON:  CT abdomen dated 12/03/2019. FINDINGS: Lower chest: Mild bibasilar atelectasis/scarring. Hepatobiliary: Small layering stones and or sludge. No pericholecystic  inflammation. Liver contours are slightly nodular suggesting underlying cirrhosis. Two punctate stones within the distal common bile duct. Two additional stones at the upper margin of the common bile duct, possibly within the the cystic duct, largest measuring 3 mm. Pancreas: Unremarkable. No pancreatic ductal dilatation or surrounding inflammatory changes. Spleen: Normal in size without focal abnormality. Adrenals/Urinary Tract: Adrenal glands appear normal. Kidneys are unremarkable without mass, stone or hydronephrosis. No ureteral or bladder calculi are identified. Bladder appears normal. Stomach/Bowel: No dilated large or small bowel loops. No convincing evidence of bowel wall inflammation. Stomach is unremarkable, partially decompressed limiting characterization of its walls. Appendix is not seen but there are no focal inflammatory changes about the cecum to suggest acute appendicitis. Vascular/Lymphatic: Advanced aortic atherosclerosis. No enlarged lymph nodes are seen. Reproductive: Presumed hysterectomy.  No adnexal mass. Other: No free fluid or abscess collection is seen. No free intraperitoneal air. Musculoskeletal: Chronic compression fracture deformities at the thoracolumbar junction, with associated degenerative change, stable. No acute appearing osseous abnormality. IMPRESSION: 1. Choledocholithiasis, with 2 punctate stones located within the distal common bile duct, and an additional 3 mm stone within the upper CBD versus cystic duct. No evidence of acute cholecystitis but would consider confirmation with RIGHT upper quadrant ultrasound and/or nuclear medicine HIDA scan. 2. Cholelithiasis. 3. No bowel obstruction or evidence of bowel wall inflammation. No renal or ureteral calculi. 4. Cirrhotic-appearing liver. Aortic Atherosclerosis (ICD10-I70.0). Electronically Signed   By: Bary RichardStan  Maynard M.D.   On: 08/22/2020 08:35   US RENAL  Result Date: 08/22/2020 CLINICAL DATA:  Initial evaluation for acute  kidney injury. EXAM: RENAL / URINARY TRACT ULTRASOUND COMPLETE COMPARISON:  Prior CT from earlier the same day. FINDINGS: Right Kidney: Renal measurements: 10.8 x 3.3 x 4.3 cm = volume: 80.2 mL. Renal echogenicity within normal limits. No nephrolithiasis or hydronephrosis. No focal renal mass. Left Kidney: Renal measurements: 11.2 x 5.1 x 4.6 cm = volume: 137.6 mL. Evaluation left kidney somewhat limited by positioning and body habitus. Visualized renal parenchyma grossly within normal limits. No nephrolithiasis or hydronephrosis. No focal renal mass. Bladder: Appears normal for  degree of bladder distention. Other: None. IMPRESSION: Normal renal ultrasound. No hydronephrosis or other significant finding. Electronically Signed   By: Rise Mu M.D.   On: 08/22/2020 04:17   DG Chest Portable 1 View  Result Date: 08/21/2020 CLINICAL DATA:  Weakness, anorexia EXAM: PORTABLE CHEST 1 VIEW COMPARISON:  12/03/2016 FINDINGS: The lungs are mildly hyperinflated suggesting changes of underlying COPD. The lungs are clear. No pneumothorax or pleural effusion. Cardiac size is within normal limits. Pulmonary vascularity normal. No acute bone abnormality. IMPRESSION: No acute cardiopulmonary process.  COPD. Electronically Signed   By: Helyn Numbers MD   On: 08/21/2020 21:21   ECHOCARDIOGRAM COMPLETE  Result Date: 08/22/2020    ECHOCARDIOGRAM REPORT   Patient Name:   DINORA HEMM Date of Exam: 08/22/2020 Medical Rec #:  [id number]     Height:       65.0 in Accession #:    [id number]    Weight:       110.0 lb Date of Birth:  [birth year]     BSA:          1.534 m Patient Age:    74 years      BP:           110/61 mmHg Patient Gender: F             HR:           86 bpm. Exam Location:  Inpatient Procedure: 2D Echo, Cardiac Doppler and Color Doppler Indications:    Atrial fibrillation  History:        Patient has no prior history of Echocardiogram examinations.                 COPD; Risk Factors:Dyslipidemia and Current  Smoker. PAD.  Sonographer:    Ross Ludwig RDCS (AE) Referring Phys: [id number] VASUNDHRA RATHORE IMPRESSIONS  1. Left ventricular ejection fraction, by estimation, is 60 to 65%. The left ventricle has normal function. The left ventricle has no regional wall motion abnormalities. There is moderate left ventricular hypertrophy. Left ventricular diastolic parameters are indeterminate.  2. Right ventricular systolic function is normal. The right ventricular size is normal. There is normal pulmonary artery systolic pressure.  3. The mitral valve is normal in structure. Trivial mitral valve regurgitation. No evidence of mitral stenosis.  4. The aortic valve is normal in structure. Aortic valve regurgitation is not visualized. No aortic stenosis is present.  5. The inferior vena cava is normal in size with <50% respiratory variability, suggesting right atrial pressure of 8 mmHg. FINDINGS  Left Ventricle: Left ventricular ejection fraction, by estimation, is 60 to 65%. The left ventricle has normal function. The left ventricle has no regional wall motion abnormalities. The left ventricular internal cavity size was normal in size. There is  moderate left ventricular hypertrophy. Left ventricular diastolic parameters are indeterminate. Indeterminate filling pressures. Right Ventricle: The right ventricular size is normal. No increase in right ventricular wall thickness. Right ventricular systolic function is normal. There is normal pulmonary artery systolic pressure. The tricuspid regurgitant velocity is 2.64 m/s, and  with an assumed right atrial pressure of 8 mmHg, the estimated right ventricular systolic pressure is 35.9 mmHg. Left Atrium: Left atrial size was normal in size. Right Atrium: Right atrial size was normal in size. Pericardium: There is no evidence of pericardial effusion. Mitral Valve: The mitral valve is normal in structure. There is moderate thickening of the mitral valve leaflet(s). Normal mobility of the  mitral valve leaflets. Moderate mitral  annular calcification. Trivial mitral valve regurgitation. No evidence of mitral valve stenosis. Tricuspid Valve: The tricuspid valve is normal in structure. Tricuspid valve regurgitation is mild . No evidence of tricuspid stenosis. Aortic Valve: The aortic valve is normal in structure.. There is severe thickening and severe calcifcation of the aortic valve. Aortic valve regurgitation is not visualized. No aortic stenosis is present. There is severe thickening of the aortic valve. There is severe calcifcation of the aortic valve. Aortic valve mean gradient measures 4.0 mmHg. Aortic valve peak gradient measures 6.6 mmHg. Aortic valve area, by VTI measures 1.28 cm. Pulmonic Valve: The pulmonic valve was normal in structure. Pulmonic valve regurgitation is not visualized. No evidence of pulmonic stenosis. Aorta: The aortic root is normal in size and structure. Venous: The inferior vena cava is normal in size with less than 50% respiratory variability, suggesting right atrial pressure of 8 mmHg. IAS/Shunts: The interatrial septum appears to be lipomatous. No atrial level shunt detected by color flow Doppler.  LEFT VENTRICLE PLAX 2D LVIDd:         3.70 cm LVIDs:         2.20 cm LV PW:         1.10 cm LV IVS:        1.40 cm LVOT diam:     1.70 cm LV SV:         33 LV SV Index:   21 LVOT Area:     2.27 cm  RIGHT VENTRICLE            IVC RV Basal diam:  2.90 cm    IVC diam: 1.90 cm RV S prime:     7.83 cm/s TAPSE (M-mode): 1.7 cm LEFT ATRIUM             Index       RIGHT ATRIUM           Index LA diam:        3.20 cm 2.09 cm/m  RA Area:     15.20 cm LA Vol (A2C):   29.5 ml 19.23 ml/m RA Volume:   35.50 ml  23.14 ml/m LA Vol (A4C):   39.7 ml 25.87 ml/m LA Biplane Vol: 37.9 ml 24.70 ml/m  AORTIC VALVE AV Area (Vmax):    1.28 cm AV Area (Vmean):   1.23 cm AV Area (VTI):     1.28 cm AV Vmax:           128.00 cm/s AV Vmean:          92.500 cm/s AV VTI:            0.255 m AV Peak  Grad:      6.6 mmHg AV Mean Grad:      4.0 mmHg LVOT Vmax:         72.40 cm/s LVOT Vmean:        50.200 cm/s LVOT VTI:          0.144 m LVOT/AV VTI ratio: 0.56  AORTA Ao Root diam: 3.40 cm Ao Asc diam:  3.10 cm TRICUSPID VALVE TR Peak grad:   27.9 mmHg TR Vmax:        264.00 cm/s  SHUNTS Systemic VTI:  0.14 m Systemic Diam: 1.70 cm Chilton Si MD Electronically signed by Chilton Si MD Signature Date/Time: 08/22/2020/1:01:16 PM    Final    US Abdomen Limited RUQ  Result Date: 08/22/2020 CLINICAL DATA:  Nausea and diarrhea for the past 5 days. EXAM: ULTRASOUND ABDOMEN  LIMITED RIGHT UPPER QUADRANT COMPARISON:  CT abdomen pelvis from same day. FINDINGS: Gallbladder: Small gallstones and sludge. No wall thickening visualized. No sonographic Murphy sign noted by sonographer. Common bile duct: Diameter: 4 mm, normal. Liver: No focal lesion identified. Within normal limits in parenchymal echogenicity. Portal vein is patent on color Doppler imaging with normal direction of blood flow towards the liver. Other: None. IMPRESSION: 1. No acute abnormality. 2. Chronic cholelithiasis and sludge. Electronically Signed   By: Obie Dredge M.D.   On: 08/22/2020 14:53    ____________________________________________   PROCEDURES  Procedure(s) performed:   Procedures  CRITICAL CARE Performed by: Maia Plan Total critical care time: 35 minutes Critical care time was exclusive of separately billable procedures and treating other patients. Critical care was necessary to treat or prevent imminent or life-threatening deterioration. Critical care was time spent personally by me on the following activities: development of treatment plan with patient and/or surrogate as well as nursing, discussions with consultants, evaluation of patient's response to treatment, examination of patient, obtaining history from patient or surrogate, ordering and performing treatments and interventions, ordering and review of  laboratory studies, ordering and review of radiographic studies, pulse oximetry and re-evaluation of patient's condition.  Alona Bene, MD Emergency Medicine  ____________________________________________   INITIAL IMPRESSION / ASSESSMENT AND PLAN / ED COURSE  Pertinent labs & imaging results that were available during my care of the patient were reviewed by me and considered in my medical decision making (see chart for details).   Patient presents to the emergency department with bright red blood per rectum in the setting of nausea and diarrhea over the past 7 to 10 days.  No hypoxemia here although did arrive on 2 L nasal cannula.  No respiratory symptoms or distress.  Reviewed the patient's home medications she appears to be on 81 mg aspirin and is not anticoagulated but will ask the pharmacy tech to confirm her home medicines.  She is Hemoccult positive but no bright red blood or melena on exam.  She is tachycardic with mild A. fib/RVR and slightly low blood pressures but mental status is normal.  Plan for CT abdomen pelvis to rule out acute diverticulitis/colitis.  Plan for IV fluids and attempt to rate control but mild RVR may be compensatory so will allow to run mild tachycardia here.   Labs pending. CT pending. Patient getting IVF. Care transferred to Dr. Rubin Payor.  ____________________________________________  FINAL CLINICAL IMPRESSION(S) / ED DIAGNOSES  Final diagnoses:  Dehydration  AKI (acute kidney injury) (HCC)  Gastrointestinal hemorrhage, unspecified gastrointestinal hemorrhage type     MEDICATIONS GIVEN DURING THIS VISIT:  Medications  ipratropium-albuterol (DUONEB) 0.5-2.5 (3) MG/3ML nebulizer solution 3 mL (has no administration in time range)  0.9 %  sodium chloride infusion (0 mLs Intravenous Stopped 08/22/20 1414)  piperacillin-tazobactam (ZOSYN) IVPB 2.25 g (0 g Intravenous Stopped 08/22/20 1401)  metoprolol tartrate (LOPRESSOR) tablet 25 mg (has no administration  in time range)  sodium chloride 0.9 % bolus 500 mL (0 mLs Intravenous Stopped 08/22/20 0053)  lactated ringers bolus 1,000 mL (0 mLs Intravenous Stopped 08/22/20 0442)  iohexol (OMNIPAQUE) 9 MG/ML oral solution (  Contrast Given 08/22/20 0609)  potassium chloride 10 mEq in 100 mL IVPB (0 mEq Intravenous Stopped 08/22/20 0833)  calcium gluconate 1 g/ 50 mL sodium chloride IVPB (0 g Intravenous Stopped 08/22/20 0609)  potassium chloride 10 mEq in 100 mL IVPB (0 mEq Intravenous Stopped 08/22/20 1414)  magnesium sulfate IVPB 4 g 100  mL (0 g Intravenous Stopped 08/22/20 1243)     Note:  This document was prepared using Dragon voice recognition software and may include unintentional dictation errors.  Alona Bene, MD, [facility] Emergency Medicine    Aamori Mcmasters, Arlyss Repress, MD 08/22/20 [phone]

## 2020-08-22 ENCOUNTER — Inpatient Hospital Stay (HOSPITAL_COMMUNITY): Payer: Medicare Other

## 2020-08-22 ENCOUNTER — Encounter (HOSPITAL_COMMUNITY): Payer: Self-pay | Admitting: Internal Medicine

## 2020-08-22 DIAGNOSIS — R197 Diarrhea, unspecified: Secondary | ICD-10-CM | POA: Diagnosis not present

## 2020-08-22 DIAGNOSIS — I739 Peripheral vascular disease, unspecified: Secondary | ICD-10-CM | POA: Diagnosis present

## 2020-08-22 DIAGNOSIS — I4891 Unspecified atrial fibrillation: Secondary | ICD-10-CM | POA: Diagnosis not present

## 2020-08-22 DIAGNOSIS — Z885 Allergy status to narcotic agent status: Secondary | ICD-10-CM | POA: Diagnosis not present

## 2020-08-22 DIAGNOSIS — I361 Nonrheumatic tricuspid (valve) insufficiency: Secondary | ICD-10-CM | POA: Diagnosis not present

## 2020-08-22 DIAGNOSIS — J449 Chronic obstructive pulmonary disease, unspecified: Secondary | ICD-10-CM | POA: Diagnosis present

## 2020-08-22 DIAGNOSIS — N179 Acute kidney failure, unspecified: Secondary | ICD-10-CM

## 2020-08-22 DIAGNOSIS — E871 Hypo-osmolality and hyponatremia: Secondary | ICD-10-CM | POA: Diagnosis present

## 2020-08-22 DIAGNOSIS — F1010 Alcohol abuse, uncomplicated: Secondary | ICD-10-CM | POA: Diagnosis present

## 2020-08-22 DIAGNOSIS — K7030 Alcoholic cirrhosis of liver without ascites: Secondary | ICD-10-CM | POA: Diagnosis present

## 2020-08-22 DIAGNOSIS — R112 Nausea with vomiting, unspecified: Secondary | ICD-10-CM | POA: Diagnosis present

## 2020-08-22 DIAGNOSIS — I10 Essential (primary) hypertension: Secondary | ICD-10-CM | POA: Diagnosis present

## 2020-08-22 DIAGNOSIS — F17210 Nicotine dependence, cigarettes, uncomplicated: Secondary | ICD-10-CM | POA: Diagnosis present

## 2020-08-22 DIAGNOSIS — R636 Underweight: Secondary | ICD-10-CM | POA: Diagnosis present

## 2020-08-22 DIAGNOSIS — R6510 Systemic inflammatory response syndrome (SIRS) of non-infectious origin without acute organ dysfunction: Secondary | ICD-10-CM

## 2020-08-22 DIAGNOSIS — K8020 Calculus of gallbladder without cholecystitis without obstruction: Secondary | ICD-10-CM | POA: Diagnosis not present

## 2020-08-22 DIAGNOSIS — E876 Hypokalemia: Secondary | ICD-10-CM | POA: Diagnosis present

## 2020-08-22 DIAGNOSIS — I480 Paroxysmal atrial fibrillation: Secondary | ICD-10-CM | POA: Diagnosis not present

## 2020-08-22 DIAGNOSIS — Z20822 Contact with and (suspected) exposure to covid-19: Secondary | ICD-10-CM | POA: Diagnosis not present

## 2020-08-22 DIAGNOSIS — K8070 Calculus of gallbladder and bile duct without cholecystitis without obstruction: Secondary | ICD-10-CM | POA: Diagnosis present

## 2020-08-22 DIAGNOSIS — E872 Acidosis: Secondary | ICD-10-CM | POA: Diagnosis present

## 2020-08-22 DIAGNOSIS — E860 Dehydration: Secondary | ICD-10-CM | POA: Diagnosis present

## 2020-08-22 DIAGNOSIS — Z681 Body mass index (BMI) 19 or less, adult: Secondary | ICD-10-CM | POA: Diagnosis not present

## 2020-08-22 DIAGNOSIS — K921 Melena: Secondary | ICD-10-CM | POA: Diagnosis not present

## 2020-08-22 DIAGNOSIS — K529 Noninfective gastroenteritis and colitis, unspecified: Secondary | ICD-10-CM | POA: Diagnosis not present

## 2020-08-22 DIAGNOSIS — E785 Hyperlipidemia, unspecified: Secondary | ICD-10-CM | POA: Diagnosis present

## 2020-08-22 DIAGNOSIS — D62 Acute posthemorrhagic anemia: Secondary | ICD-10-CM

## 2020-08-22 DIAGNOSIS — Z66 Do not resuscitate: Secondary | ICD-10-CM | POA: Diagnosis not present

## 2020-08-22 DIAGNOSIS — Z91040 Latex allergy status: Secondary | ICD-10-CM | POA: Diagnosis not present

## 2020-08-22 LAB — IRON AND TIBC
Iron: 40 ug/dL (ref 28–170)
Saturation Ratios: 18 % (ref 10.4–31.8)
TIBC: 223 ug/dL — ABNORMAL LOW (ref 250–450)
UIBC: 183 ug/dL

## 2020-08-22 LAB — BASIC METABOLIC PANEL
Anion gap: 14 (ref 5–15)
BUN: 33 mg/dL — ABNORMAL HIGH (ref 8–23)
CO2: 18 mmol/L — ABNORMAL LOW (ref 22–32)
Calcium: 7.6 mg/dL — ABNORMAL LOW (ref 8.9–10.3)
Chloride: 97 mmol/L — ABNORMAL LOW (ref 98–111)
Creatinine, Ser: 1.89 mg/dL — ABNORMAL HIGH (ref 0.44–1.00)
GFR calc Af Amer: 30 mL/min — ABNORMAL LOW (ref >60)
GFR calc non Af Amer: 26 mL/min — ABNORMAL LOW (ref >60)
Glucose, Bld: 96 mg/dL (ref 70–99)
Potassium: 3 mmol/L — ABNORMAL LOW (ref 3.5–5.1)
Sodium: 129 mmol/L — ABNORMAL LOW (ref 135–145)

## 2020-08-22 LAB — COMPREHENSIVE METABOLIC PANEL
ALT: 96 U/L — ABNORMAL HIGH (ref 0–44)
ALT: 86 U/L — ABNORMAL HIGH (ref 0–44)
AST: 169 U/L — ABNORMAL HIGH (ref 15–41)
AST: 121 U/L — ABNORMAL HIGH (ref 15–41)
Albumin: 3.1 g/dL — ABNORMAL LOW (ref 3.5–5.0)
Albumin: 2.7 g/dL — ABNORMAL LOW (ref 3.5–5.0)
Alkaline Phosphatase: 78 U/L (ref 38–126)
Alkaline Phosphatase: 69 U/L (ref 38–126)
Anion gap: 19 — ABNORMAL HIGH (ref 5–15)
Anion gap: 16 — ABNORMAL HIGH (ref 5–15)
BUN: 46 mg/dL — ABNORMAL HIGH (ref 8–23)
BUN: 40 mg/dL — ABNORMAL HIGH (ref 8–23)
CO2: 20 mmol/L — ABNORMAL LOW (ref 22–32)
CO2: 20 mmol/L — ABNORMAL LOW (ref 22–32)
Calcium: 7.4 mg/dL — ABNORMAL LOW (ref 8.9–10.3)
Calcium: 7.4 mg/dL — ABNORMAL LOW (ref 8.9–10.3)
Chloride: 90 mmol/L — ABNORMAL LOW (ref 98–111)
Chloride: 94 mmol/L — ABNORMAL LOW (ref 98–111)
Creatinine, Ser: 4.15 mg/dL — ABNORMAL HIGH (ref 0.44–1.00)
Creatinine, Ser: 3.27 mg/dL — ABNORMAL HIGH (ref 0.44–1.00)
GFR calc Af Amer: 11 mL/min — ABNORMAL LOW (ref >60)
GFR calc Af Amer: 15 mL/min — ABNORMAL LOW (ref >60)
GFR calc non Af Amer: 10 mL/min — ABNORMAL LOW (ref >60)
GFR calc non Af Amer: 13 mL/min — ABNORMAL LOW (ref >60)
Glucose, Bld: 86 mg/dL (ref 70–99)
Glucose, Bld: 79 mg/dL (ref 70–99)
Potassium: 3.7 mmol/L (ref 3.5–5.1)
Potassium: 2.5 mmol/L — CL (ref 3.5–5.1)
Sodium: 129 mmol/L — ABNORMAL LOW (ref 135–145)
Sodium: 130 mmol/L — ABNORMAL LOW (ref 135–145)
Total Bilirubin: 1.5 mg/dL — ABNORMAL HIGH (ref 0.3–1.2)
Total Bilirubin: 0.6 mg/dL (ref 0.3–1.2)
Total Protein: 5.9 g/dL — ABNORMAL LOW (ref 6.5–8.1)
Total Protein: 5.6 g/dL — ABNORMAL LOW (ref 6.5–8.1)

## 2020-08-22 LAB — ECHOCARDIOGRAM COMPLETE
AR max vel: 1.28 cm2
AV Area VTI: 1.28 cm2
AV Area mean vel: 1.23 cm2
AV Mean grad: 4 mmHg
AV Peak grad: 6.6 mmHg
Ao pk vel: 1.28 m/s
Height: 65 in
S' Lateral: 2.2 cm
Weight: 1760 oz

## 2020-08-22 LAB — CBC WITH DIFFERENTIAL/PLATELET
Abs Immature Granulocytes: 0.12 10*3/uL — ABNORMAL HIGH (ref 0.00–0.07)
Basophils Absolute: 0.1 10*3/uL (ref 0.0–0.1)
Basophils Relative: 0 %
Eosinophils Absolute: 1.3 10*3/uL — ABNORMAL HIGH (ref 0.0–0.5)
Eosinophils Relative: 8 %
HCT: 30.9 % — ABNORMAL LOW (ref 36.0–46.0)
Hemoglobin: 10.7 g/dL — ABNORMAL LOW (ref 12.0–15.0)
Immature Granulocytes: 1 %
Lymphocytes Relative: 4 %
Lymphs Abs: 0.6 10*3/uL — ABNORMAL LOW (ref 0.7–4.0)
MCH: 35.2 pg — ABNORMAL HIGH (ref 26.0–34.0)
MCHC: 34.6 g/dL (ref 30.0–36.0)
MCV: 101.6 fL — ABNORMAL HIGH (ref 80.0–100.0)
Monocytes Absolute: 1.1 10*3/uL — ABNORMAL HIGH (ref 0.1–1.0)
Monocytes Relative: 7 %
Neutro Abs: 13.2 10*3/uL — ABNORMAL HIGH (ref 1.7–7.7)
Neutrophils Relative %: 80 %
Platelets: 273 10*3/uL (ref 150–400)
RBC: 3.04 MIL/uL — ABNORMAL LOW (ref 3.87–5.11)
RDW: 14.5 % (ref 11.5–15.5)
WBC: 16.4 10*3/uL — ABNORMAL HIGH (ref 4.0–10.5)
nRBC: 0 % (ref 0.0–0.2)

## 2020-08-22 LAB — TYPE AND SCREEN
ABO/RH(D): O NEG
Antibody Screen: NEGATIVE

## 2020-08-22 LAB — SARS CORONAVIRUS 2 BY RT PCR (HOSPITAL ORDER, PERFORMED IN ~~LOC~~ HOSPITAL LAB): SARS Coronavirus 2: NEGATIVE

## 2020-08-22 LAB — TSH: TSH: 0.923 u[IU]/mL (ref 0.350–4.500)

## 2020-08-22 LAB — CBC
HCT: 29.2 % — ABNORMAL LOW (ref 36.0–46.0)
Hemoglobin: 10 g/dL — ABNORMAL LOW (ref 12.0–15.0)
MCH: 35.3 pg — ABNORMAL HIGH (ref 26.0–34.0)
MCHC: 34.2 g/dL (ref 30.0–36.0)
MCV: 103.2 fL — ABNORMAL HIGH (ref 80.0–100.0)
Platelets: 296 10*3/uL (ref 150–400)
RBC: 2.83 MIL/uL — ABNORMAL LOW (ref 3.87–5.11)
RDW: 14.5 % (ref 11.5–15.5)
WBC: 15.1 10*3/uL — ABNORMAL HIGH (ref 4.0–10.5)
nRBC: 0 % (ref 0.0–0.2)

## 2020-08-22 LAB — FOLATE: Folate: 34.5 ng/mL (ref >5.9)

## 2020-08-22 LAB — PROCALCITONIN: Procalcitonin: 4.87 ng/mL

## 2020-08-22 LAB — LACTIC ACID, PLASMA: Lactic Acid, Venous: 1.3 mmol/L (ref 0.5–1.9)

## 2020-08-22 LAB — LIPASE, BLOOD: Lipase: 56 U/L — ABNORMAL HIGH (ref 11–51)

## 2020-08-22 LAB — RETICULOCYTES
Immature Retic Fract: 27.1 % — ABNORMAL HIGH (ref 2.3–15.9)
RBC.: 2.97 MIL/uL — ABNORMAL LOW (ref 3.87–5.11)
Retic Count, Absolute: 67.7 10*3/uL (ref 19.0–186.0)
Retic Ct Pct: 2.3 % (ref 0.4–3.1)

## 2020-08-22 LAB — MAGNESIUM
Magnesium: 1.3 mg/dL — ABNORMAL LOW (ref 1.7–2.4)
Magnesium: 2.4 mg/dL (ref 1.7–2.4)

## 2020-08-22 LAB — VITAMIN B12: Vitamin B-12: 947 pg/mL — ABNORMAL HIGH (ref 180–914)

## 2020-08-22 LAB — FERRITIN: Ferritin: 164 ng/mL (ref 11–307)

## 2020-08-22 LAB — ABO/RH: ABO/RH(D): O NEG

## 2020-08-22 MED ORDER — PIPERACILLIN-TAZOBACTAM IN DEX 2-0.25 GM/50ML IV SOLN
2.2500 g | Freq: Four times a day (QID) | INTRAVENOUS | Status: DC
  Administered 2020-08-22 (×2): 2.25 g via INTRAVENOUS
  Filled 2020-08-22 (×7): qty 50

## 2020-08-22 MED ORDER — POTASSIUM CHLORIDE 10 MEQ/100ML IV SOLN
10.0000 meq | INTRAVENOUS | Status: AC
  Administered 2020-08-22 (×4): 10 meq via INTRAVENOUS
  Filled 2020-08-22 (×4): qty 100

## 2020-08-22 MED ORDER — PIPERACILLIN-TAZOBACTAM 3.375 G IVPB
3.3750 g | Freq: Three times a day (TID) | INTRAVENOUS | Status: DC
  Administered 2020-08-22 – 2020-08-25 (×8): 3.375 g via INTRAVENOUS
  Filled 2020-08-22 (×9): qty 50

## 2020-08-22 MED ORDER — IPRATROPIUM-ALBUTEROL 0.5-2.5 (3) MG/3ML IN SOLN: 3.0000 mL | Freq: Four times a day (QID) | RESPIRATORY_TRACT | Status: DC | PRN

## 2020-08-22 MED ORDER — PANTOPRAZOLE SODIUM 40 MG IV SOLR: 40.0000 mg | INTRAVENOUS | Status: DC

## 2020-08-22 MED ORDER — CALCIUM GLUCONATE-NACL 1-0.675 GM/50ML-% IV SOLN
1.0000 g | Freq: Once | INTRAVENOUS | Status: AC
  Administered 2020-08-22: 1000 mg via INTRAVENOUS
  Filled 2020-08-22: qty 50

## 2020-08-22 MED ORDER — LACTATED RINGERS IV BOLUS
1000.0000 mL | Freq: Once | INTRAVENOUS | Status: AC
  Administered 2020-08-22: 1000 mL via INTRAVENOUS

## 2020-08-22 MED ORDER — METOPROLOL TARTRATE 25 MG PO TABS
25.0000 mg | ORAL_TABLET | Freq: Two times a day (BID) | ORAL | Status: DC
  Administered 2020-08-22: 25 mg via ORAL
  Filled 2020-08-22 (×2): qty 1

## 2020-08-22 MED ORDER — POTASSIUM CHLORIDE 10 MEQ/100ML IV SOLN
10.0000 meq | INTRAVENOUS | Status: AC
  Administered 2020-08-22 (×3): 10 meq via INTRAVENOUS
  Filled 2020-08-22 (×3): qty 100

## 2020-08-22 MED ORDER — MAGNESIUM SULFATE 4 GM/100ML IV SOLN
4.0000 g | Freq: Once | INTRAVENOUS | Status: AC
  Administered 2020-08-22: 4 g via INTRAVENOUS
  Filled 2020-08-22: qty 100

## 2020-08-22 MED ORDER — IOHEXOL 9 MG/ML PO SOLN
ORAL | Status: AC
  Filled 2020-08-22: qty 1000

## 2020-08-22 MED ORDER — SODIUM CHLORIDE 0.9 % IV SOLN: INTRAVENOUS | Status: AC

## 2020-08-22 NOTE — Progress Notes (Signed)
Briefly, patient is a 74 year old female with history of alcohol use, COPD, HTN and hyperlipidemia who was admitted earlier today with nausea vomiting and bloody diarrhea and found to have new onset atrial fibrillation and also found to be in acute renal failure..    Treatment was initiated with IV fluid resuscitation and treatment with Zosyn.  This morning, patient is found to be hemodynamically stable, reasonable control of heart rate and continued bloody diarrhea.  Repeat labs show improvement in her kidney function and continued profound hypokalemia and hypomagnesemia.  CT of abdomen today shows no evidence of colitis but did show cirrhosis of the liver and probable cholelithiasis.   Plan for today is to continue aggressive repletion of potassium and magnesium.  Continue Zosyn especially given elevated procalcitonin level. Cardiology and GI have been consulted. C. difficile and GI panel are pending. Follow H&H.  Results of tests and plan has been discussed with the patient and her daughter who was at bedside. Patient and daughter state that they did not know she had cirrhosis but they both admit that she has significant alcohol use.  Patient states she has not had any alcohol use over the past week and denies any history of complicated withdrawal.  She does not appear to be in hyperadrenergic state suggestive of withdrawal at present.

## 2020-08-22 NOTE — Progress Notes (Signed)
Pharmacy Antibiotic Note  Erin Calderon is a 74 y.o. female admitted on 08/21/2020 with intra-abdominal infection.; procalcitonin elevated at 4.87. Pharmacy was consulted for Zosyn dosing.  Pt with AKI on admission (baseline Scr <1, Scr on admission 4.15, Scr now down to 1.89 this evening, CrCl 20.3 ml/min).  WBC 15.1, afebrile  Plan: Increase Zosyn to 3.375 gm IV Q 8 hrs (extended infusion) Monitor WBC, temp, clinical improvement, cultures, renal function  Height: 5\' 5"  (165.1 cm) Weight: 49.9 kg (110 lb) IBW/kg (Calculated) : 57  Temp (24hrs), Avg:98.2 F (36.8 C), Min:97.9 F (36.6 C), Max:98.4 F (36.9 C)  Recent Labs  Lab 08/21/20 2305 08/22/20 0314 08/22/20 0453 08/22/20 1750  WBC 16.4* 15.1*  --   --   CREATININE 4.15* 3.27*  --  1.89*  LATICACIDVEN  --   --  1.3  --     Estimated Creatinine Clearance: 20.3 mL/min (A) (by C-G formula based on SCr of 1.89 mg/dL (H)).    Allergies  Allergen Reactions  . Hydrocodone Nausea And Vomiting  . Latex Itching and Rash    Reaction to latex gloves and rubber goods    Thank you for allowing pharmacy to be a part of this patient's care.  Microbiology data: 8/23 COVID: negative 8/24 Bld cx X 2: pending  9/24, PharmD, BCPS, [facility] Clinical Pharmacist 08/22/2020 8:18 PM

## 2020-08-22 NOTE — Consult Note (Signed)
[facility] [address] [facility] Consultation Note  Referring Provider: No ref. provider found Primary Care Physician:  Clayborn Heron, MD Primary Gastroenterologist:  Dr. Marca Ancona [facility])  Reason for Consultation:  Rectal bleeding  HPI: Cynthya Yam is a 74 y.o. female with history of alcoholic cirrhosis, COPD, currently in A fib, presenting with a chief complaint of rectal bleeding.  Patient started having diarrhea with rectal bleeding on Saturday (3 days ago).  She has had multiple episodes of bloody diarrhea daily, though she is unable to quantify ~number of episodes.  Denies any melena.  Notes minor lower abdominal pain. Endorses shortness of breath but denies dizziness, syncope, chest pain.  Reports nausea and vomiting sometimes, though not recently.  However, per hospitalist report, patient told hospitalist that she has been having nausea with nonbloody emesis.  Patient denies any dysphagia, GERD.    Patient reports weight loss over several years.  Patient's daughter states that patient's weight has remained pretty stable.  Patient denies sick contacts, exposure to foods that could have caused food poisoning, or recent antibiotic exposure.  Patient takes 81 mg aspirin daily but denies NSAID or blood thinner use.  Patient has never had an EGD or colonoscopy.  Patient was scheduled for both EGD and colonoscopy earlier this year due to FOBT positivity.  However, patient canceled procedures and did not reschedule.   Past Medical History:  Diagnosis Date  . Hypertension     No past surgical history on file.  Prior to Admission medications   Medication Sig Start Date End Date Taking? Authorizing Provider  albuterol (PROVENTIL HFA;VENTOLIN HFA) 108 (90 Base) MCG/ACT inhaler Inhale 2 puffs into the lungs every 6 (six) hours as needed for wheezing or shortness of breath.   Yes [provider]  albuterol (PROVENTIL) (2.5 MG/3ML) 0.083% nebulizer solution Take 2.5 mg by nebulization  every 6 (six) hours as needed for wheezing or shortness of breath.   Yes [provider]  amLODipine (NORVASC) 5 MG tablet Take 5 mg by mouth daily. 08/09/20  Yes [provider]  aspirin EC 81 MG tablet Take 81 mg by mouth daily.   Yes [provider]  Cyanocobalamin (VITAMIN B-12) 500 MCG SUBL Place 1,000 mcg under the tongue daily.   Yes [provider]  fluticasone (FLONASE) 50 MCG/ACT nasal spray Place 1-2 sprays into both nostrils daily as needed for allergies or rhinitis.   Yes [provider]  folic acid (FOLVITE) 400 MCG tablet Take 400 mcg by mouth daily.   Yes [provider]  hydrochlorothiazide (MICROZIDE) 12.5 MG capsule Take 12.5 mg by mouth daily.  11/18/19  Yes [provider]  losartan (COZAAR) 100 MG tablet Take 100 mg by mouth daily.  11/18/19  Yes [provider]  Multiple Vitamin (MULTIVITAMIN WITH MINERALS) TABS tablet Take 1 tablet by mouth daily.   Yes [provider]  pantoprazole (PROTONIX) 40 MG tablet Take 1 tablet (40 mg total) by mouth daily. 12/03/16  Yes Nyoka Cowden, MD  rosuvastatin (CRESTOR) 20 MG tablet Take 1 tablet (20 mg total) by mouth daily. 01/03/20 08/22/29 Yes Jodelle Red, MD  traMADol (ULTRAM) 50 MG tablet Take 50 mg by mouth every 6 (six) hours as needed for moderate pain.   Yes [provider]  vitamin E 200 UNIT capsule Take 200 Units by mouth daily.   Yes [provider]  bisoprolol (ZEBETA) 10 MG tablet Take 1 tablet (10 mg total) by mouth daily. Patient not taking: Reported on  08/22/2020 12/03/16   Nyoka CowdenWert, Michael B, MD  metoprolol tartrate (LOPRESSOR) 100 MG tablet Take 2 hours prior to test. Patient not taking: Reported on 08/22/2020 11/24/19   Runell GessBerry, Jonathan J, MD  potassium chloride (K-DUR) 10 MEQ tablet Take 1 tablet (10 mEq total) by mouth daily. Patient not taking: Reported on 08/22/2020 01/19/18   Bethann BerkshireZammit, Joseph, MD    Current  Facility-Administered Medications  Medication Dose Route Frequency Provider Last Rate Last Admin  . 0.9 %  sodium chloride infusion   Intravenous Continuous John Giovanniathore, Vasundhra, MD 125 mL/hr at 08/22/20 0505 New Bag at 08/22/20 0505  . ipratropium-albuterol (DUONEB) 0.5-2.5 (3) MG/3ML nebulizer solution 3 mL  3 mL Nebulization Q6H PRN John Giovanniathore, Vasundhra, MD      . piperacillin-tazobactam (ZOSYN) IVPB 2.25 g  2.25 g Intravenous Q6H Bryk, Veronda P, RPH 100 mL/hr at 08/22/20 1303 2.25 g at 08/22/20 1303  . potassium chloride 10 mEq in 100 mL IVPB  10 mEq Intravenous Q1 Hr x 4 Leandro Reasonerhatterjee, Srobona Tublu, MD 100 mL/hr at 08/22/20 1305 10 mEq at 08/22/20 1305   Current Outpatient Medications  Medication Sig Dispense Refill  . albuterol (PROVENTIL HFA;VENTOLIN HFA) 108 (90 Base) MCG/ACT inhaler Inhale 2 puffs into the lungs every 6 (six) hours as needed for wheezing or shortness of breath.    Marland Kitchen. albuterol (PROVENTIL) (2.5 MG/3ML) 0.083% nebulizer solution Take 2.5 mg by nebulization every 6 (six) hours as needed for wheezing or shortness of breath.    Marland Kitchen. amLODipine (NORVASC) 5 MG tablet Take 5 mg by mouth daily.    Marland Kitchen. aspirin EC 81 MG tablet Take 81 mg by mouth daily.    . Cyanocobalamin (VITAMIN B-12) 500 MCG SUBL Place 1,000 mcg under the tongue daily.    . fluticasone (FLONASE) 50 MCG/ACT nasal spray Place 1-2 sprays into both nostrils daily as needed for allergies or rhinitis.    . folic acid (FOLVITE) 400 MCG tablet Take 400 mcg by mouth daily.    . hydrochlorothiazide (MICROZIDE) 12.5 MG capsule Take 12.5 mg by mouth daily.     Marland Kitchen. losartan (COZAAR) 100 MG tablet Take 100 mg by mouth daily.     . Multiple Vitamin (MULTIVITAMIN WITH MINERALS) TABS tablet Take 1 tablet by mouth daily.    . pantoprazole (PROTONIX) 40 MG tablet Take 1 tablet (40 mg total) by mouth daily. 30 tablet 2  . rosuvastatin (CRESTOR) 20 MG tablet Take 1 tablet (20 mg total) by mouth daily. 90 tablet 3  . traMADol (ULTRAM) 50 MG  tablet Take 50 mg by mouth every 6 (six) hours as needed for moderate pain.    . vitamin E 200 UNIT capsule Take 200 Units by mouth daily.    . bisoprolol (ZEBETA) 10 MG tablet Take 1 tablet (10 mg total) by mouth daily. (Patient not taking: Reported on 08/22/2020) 30 tablet 11  . metoprolol tartrate (LOPRESSOR) 100 MG tablet Take 2 hours prior to test. (Patient not taking: Reported on 08/22/2020) 1 tablet 0  . potassium chloride (K-DUR) 10 MEQ tablet Take 1 tablet (10 mEq total) by mouth daily. (Patient not taking: Reported on 08/22/2020) 7 tablet 0    Allergies as of 08/21/2020 - Review Complete 01/03/2020  Allergen Reaction Noted  . Hydrocodone Nausea And Vomiting 10/14/2014  . Latex Itching and Rash 01/19/2018    History reviewed. No pertinent family history.  Social History   Socioeconomic History  . Marital status: Widowed    Spouse name: Not on file  .  Number of children: Not on file  . Years of education: Not on file  . Highest education level: Not on file  Occupational History  . Not on file  Tobacco Use  . Smoking status: Current Every Day Smoker    Packs/day: 0.50    Types: Cigarettes  . Smokeless tobacco: Never Used  Substance and Sexual Activity  . Alcohol use: Yes  . Drug use: No  . Sexual activity: Not on file  Other Topics Concern  . Not on file  Social History Narrative  . Not on file   Social Determinants of Health   Financial Resource Strain:   . Difficulty of Paying Living Expenses: Not on file  Food Insecurity:   . Worried About Programme researcher, broadcasting/film/video in the Last Year: Not on file  . Ran Out of Food in the Last Year: Not on file  Transportation Needs:   . Lack of Transportation (Medical): Not on file  . Lack of Transportation (Non-Medical): Not on file  Physical Activity:   . Days of Exercise per Week: Not on file  . Minutes of Exercise per Session: Not on file  Stress:   . Feeling of Stress : Not on file  Social Connections:   . Frequency of  Communication with Friends and Family: Not on file  . Frequency of Social Gatherings with Friends and Family: Not on file  . Attends Religious Services: Not on file  . Active Member of Clubs or Organizations: Not on file  . Attends Banker Meetings: Not on file  . Marital Status: Not on file  Intimate Partner Violence:   . Fear of Current or Ex-Partner: Not on file  . Emotionally Abused: Not on file  . Physically Abused: Not on file  . Sexually Abused: Not on file    Review of Systems: Review of Systems  Constitutional: Positive for malaise/fatigue and weight loss. Negative for chills and fever.  HENT: Negative for hearing loss and tinnitus.   Eyes: Negative for pain and redness.  Respiratory: Positive for shortness of breath. Negative for cough.   Cardiovascular: Negative for chest pain and palpitations.  Gastrointestinal: Positive for abdominal pain, blood in stool, diarrhea, nausea and vomiting. Negative for constipation, heartburn and melena.  Genitourinary: Negative for flank pain and hematuria.  Musculoskeletal: Negative for falls and joint pain.  Skin: Negative for itching and rash.  Neurological: Negative for seizures and loss of consciousness.  Endo/Heme/Allergies: Negative for polydipsia. Does not bruise/bleed easily.  Psychiatric/Behavioral: Positive for substance abuse (ETOH). The patient is not nervous/anxious.     Physical Exam: Vital signs in last 24 hours: Temp:  [98.4 F (36.9 C)] 98.4 F (36.9 C) (08/23 2106) Pulse Rate:  [85-113] 86 (08/24 1200) Resp:  [14-25] 15 (08/24 1200) BP: (91-130)/(47-76) 110/61 (08/24 1200) SpO2:  [90 %-100 %] 99 % (08/24 1200) Weight:  [49.9 kg] 49.9 kg (08/23 2106)   General:  Lethargic, thin, elderly, pleasant and cooperative in NAD Head:  Normocephalic and atraumatic. Eyes:  Sclera clear, no icterus.   Conjunctiva pink. Ears:  Normal auditory acuity. Nose:  No deformity, discharge, or lesions. Mouth:  No  deformity or lesions.  Oropharynx pink & moist. Neck:  Supple; no masses or thyromegaly. Lungs:  Clear throughout to auscultation.   No wheezes, crackles, or rhonchi. No acute distress. Heart:  Regular rate, irregular rhythm (A. Fib) Abdomen:  Soft, nondistended, mild diffuse tenderness. No masses, hepatosplenomegaly or hernias noted. Normal bowel sounds, without guarding, and  without rebound.     Msk:  Symmetrical without gross deformities. Normal posture. Pulses:  Normal pulses noted. Extremities:  Without clubbing or edema. Neurologic:  Able to state location, year, but unable to report president. Skin:  Intact without significant lesions or rashes. Cervical Nodes:  No significant cervical adenopathy. Psych:  Alert and cooperative. Normal mood and affect.   Lab Results: Recent Labs    08/21/20 2305 08/22/20 0314  WBC 16.4* 15.1*  HGB 10.7* 10.0*  HCT 30.9* 29.2*  PLT 273 296   BMET Recent Labs    08/21/20 2305 08/22/20 0314  NA 129* 130*  K 3.7 2.5*  CL 90* 94*  CO2 20* 20*  GLUCOSE 86 79  BUN 46* 40*  CREATININE 4.15* 3.27*  CALCIUM 7.4* 7.4*   LFT Recent Labs    08/22/20 0314  PROT 5.6*  ALBUMIN 2.7*  AST 121*  ALT 86*  ALKPHOS 69  BILITOT 0.6   PT/INR Recent Labs    08/21/20 2305  LABPROT 12.1  INR 0.9    Studies/Results: CT ABDOMEN PELVIS WO CONTRAST  Result Date: 08/22/2020 CLINICAL DATA:  Loss of appetite, nausea, diarrhea for 5 days. History of abdominal pain. Bloody stools for 2 days. Suspected infectious gastroenteritis or colitis. EXAM: CT ABDOMEN AND PELVIS WITHOUT CONTRAST TECHNIQUE: Multidetector CT imaging of the abdomen and pelvis was performed following the standard protocol without IV contrast. COMPARISON:  CT abdomen dated 12/03/2019. FINDINGS: Lower chest: Mild bibasilar atelectasis/scarring. Hepatobiliary: Small layering stones and or sludge. No pericholecystic inflammation. Liver contours are slightly nodular suggesting underlying  cirrhosis. Two punctate stones within the distal common bile duct. Two additional stones at the upper margin of the common bile duct, possibly within the the cystic duct, largest measuring 3 mm. Pancreas: Unremarkable. No pancreatic ductal dilatation or surrounding inflammatory changes. Spleen: Normal in size without focal abnormality. Adrenals/Urinary Tract: Adrenal glands appear normal. Kidneys are unremarkable without mass, stone or hydronephrosis. No ureteral or bladder calculi are identified. Bladder appears normal. Stomach/Bowel: No dilated large or small bowel loops. No convincing evidence of bowel wall inflammation. Stomach is unremarkable, partially decompressed limiting characterization of its walls. Appendix is not seen but there are no focal inflammatory changes about the cecum to suggest acute appendicitis. Vascular/Lymphatic: Advanced aortic atherosclerosis. No enlarged lymph nodes are seen. Reproductive: Presumed hysterectomy.  No adnexal mass. Other: No free fluid or abscess collection is seen. No free intraperitoneal air. Musculoskeletal: Chronic compression fracture deformities at the thoracolumbar junction, with associated degenerative change, stable. No acute appearing osseous abnormality. IMPRESSION: 1. Choledocholithiasis, with 2 punctate stones located within the distal common bile duct, and an additional 3 mm stone within the upper CBD versus cystic duct. No evidence of acute cholecystitis but would consider confirmation with RIGHT upper quadrant ultrasound and/or nuclear medicine HIDA scan. 2. Cholelithiasis. 3. No bowel obstruction or evidence of bowel wall inflammation. No renal or ureteral calculi. 4. Cirrhotic-appearing liver. Aortic Atherosclerosis (ICD10-I70.0). Electronically Signed   By: Bary Richard M.D.   On: 08/22/2020 08:35   US RENAL  Result Date: 08/22/2020 CLINICAL DATA:  Initial evaluation for acute kidney injury. EXAM: RENAL / URINARY TRACT ULTRASOUND COMPLETE  COMPARISON:  Prior CT from earlier the same day. FINDINGS: Right Kidney: Renal measurements: 10.8 x 3.3 x 4.3 cm = volume: 80.2 mL. Renal echogenicity within normal limits. No nephrolithiasis or hydronephrosis. No focal renal mass. Left Kidney: Renal measurements: 11.2 x 5.1 x 4.6 cm = volume: 137.6 mL. Evaluation left kidney somewhat limited  by positioning and body habitus. Visualized renal parenchyma grossly within normal limits. No nephrolithiasis or hydronephrosis. No focal renal mass. Bladder: Appears normal for degree of bladder distention. Other: None. IMPRESSION: Normal renal ultrasound. No hydronephrosis or other significant finding. Electronically Signed   By: Rise Mu M.D.   On: 08/22/2020 04:17   DG Chest Portable 1 View  Result Date: 08/21/2020 CLINICAL DATA:  Weakness, anorexia EXAM: PORTABLE CHEST 1 VIEW COMPARISON:  12/03/2016 FINDINGS: The lungs are mildly hyperinflated suggesting changes of underlying COPD. The lungs are clear. No pneumothorax or pleural effusion. Cardiac size is within normal limits. Pulmonary vascularity normal. No acute bone abnormality. IMPRESSION: No acute cardiopulmonary process.  COPD. Electronically Signed   By: Helyn Numbers MD   On: 08/21/2020 21:21   ECHOCARDIOGRAM COMPLETE  Result Date: 08/22/2020    ECHOCARDIOGRAM REPORT   Patient Name:   OAKLEE ESTHER Date of Exam: 08/22/2020 Medical Rec #:  [id number]     Height:       65.0 in Accession #:    [id number]    Weight:       110.0 lb Date of Birth:  [birth year]     BSA:          1.534 m Patient Age:    74 years      BP:           110/61 mmHg Patient Gender: F             HR:           86 bpm. Exam Location:  Inpatient Procedure: 2D Echo, Cardiac Doppler and Color Doppler Indications:    Atrial fibrillation  History:        Patient has no prior history of Echocardiogram examinations.                 COPD; Risk Factors:Dyslipidemia and Current Smoker. PAD.  Sonographer:    Ross Ludwig RDCS (AE) Referring  Phys: [id number] VASUNDHRA RATHORE IMPRESSIONS  1. Left ventricular ejection fraction, by estimation, is 60 to 65%. The left ventricle has normal function. The left ventricle has no regional wall motion abnormalities. There is moderate left ventricular hypertrophy. Left ventricular diastolic parameters are indeterminate.  2. Right ventricular systolic function is normal. The right ventricular size is normal. There is normal pulmonary artery systolic pressure.  3. The mitral valve is normal in structure. Trivial mitral valve regurgitation. No evidence of mitral stenosis.  4. The aortic valve is normal in structure. Aortic valve regurgitation is not visualized. No aortic stenosis is present.  5. The inferior vena cava is normal in size with <50% respiratory variability, suggesting right atrial pressure of 8 mmHg. FINDINGS  Left Ventricle: Left ventricular ejection fraction, by estimation, is 60 to 65%. The left ventricle has normal function. The left ventricle has no regional wall motion abnormalities. The left ventricular internal cavity size was normal in size. There is  moderate left ventricular hypertrophy. Left ventricular diastolic parameters are indeterminate. Indeterminate filling pressures. Right Ventricle: The right ventricular size is normal. No increase in right ventricular wall thickness. Right ventricular systolic function is normal. There is normal pulmonary artery systolic pressure. The tricuspid regurgitant velocity is 2.64 m/s, and  with an assumed right atrial pressure of 8 mmHg, the estimated right ventricular systolic pressure is 35.9 mmHg. Left Atrium: Left atrial size was normal in size. Right Atrium: Right atrial size was normal in size. Pericardium: There is no evidence of pericardial effusion. Mitral Valve: The mitral  valve is normal in structure. There is moderate thickening of the mitral valve leaflet(s). Normal mobility of the mitral valve leaflets. Moderate mitral annular calcification.  Trivial mitral valve regurgitation. No evidence of mitral valve stenosis. Tricuspid Valve: The tricuspid valve is normal in structure. Tricuspid valve regurgitation is mild . No evidence of tricuspid stenosis. Aortic Valve: The aortic valve is normal in structure.. There is severe thickening and severe calcifcation of the aortic valve. Aortic valve regurgitation is not visualized. No aortic stenosis is present. There is severe thickening of the aortic valve. There is severe calcifcation of the aortic valve. Aortic valve mean gradient measures 4.0 mmHg. Aortic valve peak gradient measures 6.6 mmHg. Aortic valve area, by VTI measures 1.28 cm. Pulmonic Valve: The pulmonic valve was normal in structure. Pulmonic valve regurgitation is not visualized. No evidence of pulmonic stenosis. Aorta: The aortic root is normal in size and structure. Venous: The inferior vena cava is normal in size with less than 50% respiratory variability, suggesting right atrial pressure of 8 mmHg. IAS/Shunts: The interatrial septum appears to be lipomatous. No atrial level shunt detected by color flow Doppler.  LEFT VENTRICLE PLAX 2D LVIDd:         3.70 cm LVIDs:         2.20 cm LV PW:         1.10 cm LV IVS:        1.40 cm LVOT diam:     1.70 cm LV SV:         33 LV SV Index:   21 LVOT Area:     2.27 cm  RIGHT VENTRICLE            IVC RV Basal diam:  2.90 cm    IVC diam: 1.90 cm RV S prime:     7.83 cm/s TAPSE (M-mode): 1.7 cm LEFT ATRIUM             Index       RIGHT ATRIUM           Index LA diam:        3.20 cm 2.09 cm/m  RA Area:     15.20 cm LA Vol (A2C):   29.5 ml 19.23 ml/m RA Volume:   35.50 ml  23.14 ml/m LA Vol (A4C):   39.7 ml 25.87 ml/m LA Biplane Vol: 37.9 ml 24.70 ml/m  AORTIC VALVE AV Area (Vmax):    1.28 cm AV Area (Vmean):   1.23 cm AV Area (VTI):     1.28 cm AV Vmax:           128.00 cm/s AV Vmean:          92.500 cm/s AV VTI:            0.255 m AV Peak Grad:      6.6 mmHg AV Mean Grad:      4.0 mmHg LVOT Vmax:          72.40 cm/s LVOT Vmean:        50.200 cm/s LVOT VTI:          0.144 m LVOT/AV VTI ratio: 0.56  AORTA Ao Root diam: 3.40 cm Ao Asc diam:  3.10 cm TRICUSPID VALVE TR Peak grad:   27.9 mmHg TR Vmax:        264.00 cm/s  SHUNTS Systemic VTI:  0.14 m Systemic Diam: 1.70 cm Chilton Si MD Electronically signed by Chilton Si MD Signature Date/Time: 08/22/2020/1:01:16 PM    Final  Impression: Rectal bleeding, diarrhea: -Hemoglobin 10.0 today, decreased from baseline of 12.5 as of 08/2019  Cirrhosis, appears compensated -T bili 0.6/AST 121/ALT 86/ALP 69, improved from yesterday T bili 1.5/AST 169/ALT 96/ALP 78 - INR 0.9 -Platelets normal (273)  Severe hypokalemia: Potassium 2.5 today  AKI: BUN 40/Cr 3.27 today, improved from yesterday BUN 46/Cr 4.15  Possible choledocholithiasis per noncontrast CT, though patient asymptomatic  New-onset A fib  Plan: Volume repletion and correction of hypokalemia and other electrolyte imbalances.  Await stool studies.  If negative, consider colonoscopy vs. Flex sig.  Once kidney function improves, plan for MRCP w/ contrast.  Continue to monitor H&H with transfusion as needed to maintain Hgb >7.  [facility] will follow.   LOS: 0 days   Edrick Kins  08/22/2020, 1:39 PM  Cell [phone] If no answer or after 5 PM call [phone]

## 2020-08-22 NOTE — Plan of Care (Signed)

## 2020-08-22 NOTE — ED Provider Notes (Signed)
  Physical Exam  BP 123/76   Pulse (!) 110   Temp 98.4 F (36.9 C) (Oral)   Resp (!) 21   Ht 5\' 5"  (1.651 m)   Wt 49.9 kg   SpO2 99%   BMI 18.30 kg/m   Physical Exam  ED Course/Procedures     Procedures  MDM  Received patient in signout.  3 days of diarrhea loss of appetite with nausea.  Decreased oral intake.  Also blood in stool for 2 days.  On aspirin for A. fib without other blood thinners.  Hemoglobin mildly decreased to 10.  However does have an acute kidney injury.  Also in A. fib with mild RVR.  I think this more likely due to dehydration especially with her acute kidney injury.  Blood pressure improved with fluid.  CT scan canceled due to acute kidney injury.  Will admit to hospitalist.       , MD 08/22/20 [phone]

## 2020-08-22 NOTE — ED Notes (Signed)
Pt aware urine sample is needed 

## 2020-08-22 NOTE — Progress Notes (Signed)
Pharmacy Antibiotic Note  Erin Calderon is a 74 y.o. female admitted on 08/21/2020 with intra-abdominal infection.  Pharmacy has been consulted for Zosyn dosing.  Pt w/ AKI, baseline SCr <1, now 4.15.  Plan: Zosyn 2.25g IV Q6H.  Height: 5\' 5"  (165.1 cm) Weight: 49.9 kg (110 lb) IBW/kg (Calculated) : 57  Temp (24hrs), Avg:98.4 F (36.9 C), Min:98.4 F (36.9 C), Max:98.4 F (36.9 C)  Recent Labs  Lab 08/21/20 2305  WBC 16.4*  CREATININE 4.15*    Estimated Creatinine Clearance: 9.2 mL/min (A) (by C-G formula based on SCr of 4.15 mg/dL (H)).    Allergies  Allergen Reactions  . Hydrocodone Nausea And Vomiting  . Latex Itching and Rash    Reaction to latex gloves and rubber goods    Thank you for allowing pharmacy to be a part of this patient's care.  08/23/20, PharmD, BCPS  08/22/2020 3:26 AM

## 2020-08-22 NOTE — ED Notes (Signed)
Pt urinated and had very loose bowel movement. Unable to attain a sample, as it was only a smear on the brief.

## 2020-08-22 NOTE — Consult Note (Addendum)
Cardiology Consultation:   Patient ID: Erin Calderon MRN: [id number]; DOB: 04-05-1946  Admit date: 08/21/2020 Date of Consult: 08/22/2020  Primary Care Provider: Clayborn Heron, MD [facility] Cardiologist: Erin Red, MD  [facility] Electrophysiologist:  None    Patient Profile:   Erin Calderon is a 74 y.o. female with a hx of tobacco use, HTN, peripheral vascular disease, HLD, COPD who is being seen today for the evaluation of Afib at the request of afib.  History of Present Illness:   Erin Calderon is followed by Dr. Cristal Calderon for the above cardiac issues. Before that patient was followed by Dr. Donnie Calderon. Per his notes Echo 03/16/2018 showed EF 65%, mod LVH, G1DD, mild to mod TR, mild to mod PR. No documented stress or calf. Patient follows with Dr. Allyson Calderon for PAD. Patient was last seen 01/03/20 for f/u for unilateral LLE thought to be lymph related. DVT study negatie. ABI 0.93 on R and 0.91 on L, venous reflex studies unremarkable, CT angio with runoff with diffuse PAD but no clear hemodynamically significant obstructions in LE. Continued compressions was recommended.   The patient presented to the ED with multiple complaints. She reported nausea, vomiting and diarrhea for the last 10 days. Diarrhea bloody for the last 5 days. No chest pain, sob, palpitations, lightheadedness, dizziness. Also had some abdominal discomfort. Denies fever, chills, recent illness. She takes ASA 81mg  daily. She is a long time smoker, but is trying to decrease use. She drinks 3 glasses of wine daily. She lives in a ?community. She is able to perform ADLs. Diet is generally frozen food.   In the ED BP 123/76, pulse 110, afebrile, RR 21, 99% O2. Labs showed potassium 3.7, creatinine 4.15, BUN 46, anion gap 19, Mag 1.3, albumin 3.1, AST 169, ALT 96, WBC 16.4, Hgb 10.7. Blood cultures collected. COVID negative. Occult blood positive. CXR with COPD and no acute process. CT abd/pelvis choledocholithiasis,  2 punctate stones distal common bile duct, and an additional 3 mm stone in upper CBD. No acute cholecystitis but consider RUQ , no bowel obstruction, cholelithiasis. Renal US normal. EKG showed Afib RVR. The patient was given 1.5L IVF bolus.   Past Medical History:  Diagnosis Date  . Hypertension     No past surgical history on file.   Home Medications:  Prior to Admission medications   Medication Sig Start Date End Date Taking? Authorizing Provider  albuterol (PROVENTIL HFA;VENTOLIN HFA) 108 (90 Base) MCG/ACT inhaler Inhale 2 puffs into the lungs every 6 (six) hours as needed for wheezing or shortness of breath.   Yes [provider]  albuterol (PROVENTIL) (2.5 MG/3ML) 0.083% nebulizer solution Take 2.5 mg by nebulization every 6 (six) hours as needed for wheezing or shortness of breath.   Yes [provider]  amLODipine (NORVASC) 5 MG tablet Take 5 mg by mouth daily. 08/09/20  Yes [provider]  aspirin EC 81 MG tablet Take 81 mg by mouth daily.   Yes [provider]  Cyanocobalamin (VITAMIN B-12) 500 MCG SUBL Place 1,000 mcg under the tongue daily.   Yes [provider]  fluticasone (FLONASE) 50 MCG/ACT nasal spray Place 1-2 sprays into both nostrils daily as needed for allergies or rhinitis.   Yes [provider]  folic acid (FOLVITE) 400 MCG tablet Take 400 mcg by mouth daily.   Yes [provider]  hydrochlorothiazide (MICROZIDE) 12.5 MG capsule Take 12.5 mg by mouth daily.  11/18/19  Yes [provider]  losartan (COZAAR)  100 MG tablet Take 100 mg by mouth daily.  11/18/19  Yes [provider]  Multiple Vitamin (MULTIVITAMIN WITH MINERALS) TABS tablet Take 1 tablet by mouth daily.   Yes [provider]  pantoprazole (PROTONIX) 40 MG tablet Take 1 tablet (40 mg total) by mouth daily. 12/03/16  Yes Erin Cowden, MD  rosuvastatin (CRESTOR) 20 MG tablet Take 1 tablet (20 mg total) by mouth daily.  01/03/20 08/22/29 Yes Erin Red, MD  traMADol (ULTRAM) 50 MG tablet Take 50 mg by mouth every 6 (six) hours as needed for moderate pain.   Yes [provider]  vitamin E 200 UNIT capsule Take 200 Units by mouth daily.   Yes [provider]  bisoprolol (ZEBETA) 10 MG tablet Take 1 tablet (10 mg total) by mouth daily. Patient not taking: Reported on 08/22/2020 12/03/16   Erin Cowden, MD  metoprolol tartrate (LOPRESSOR) 100 MG tablet Take 2 hours prior to test. Patient not taking: Reported on 08/22/2020 11/24/19   Erin Gess, MD  potassium chloride (K-DUR) 10 MEQ tablet Take 1 tablet (10 mEq total) by mouth daily. Patient not taking: Reported on 08/22/2020 01/19/18   Erin Berkshire, MD    Inpatient Medications: Scheduled Meds:  Continuous Infusions: . sodium chloride 125 mL/hr at 08/22/20 0505  . piperacillin-tazobactam (ZOSYN)  IV 2.25 g (08/22/20 1303)  . potassium chloride 10 mEq (08/22/20 1305)   PRN Meds: ipratropium-albuterol  Allergies:    Allergies  Allergen Reactions  . Hydrocodone Nausea And Vomiting  . Latex Itching and Rash    Reaction to latex gloves and rubber goods    Social History:   Social History   Socioeconomic History  . Marital status: Widowed    Spouse name: Not on file  . Number of children: Not on file  . Years of education: Not on file  . Highest education level: Not on file  Occupational History  . Not on file  Tobacco Use  . Smoking status: Current Every Day Smoker    Packs/day: 0.50    Types: Cigarettes  . Smokeless tobacco: Never Used  Substance and Sexual Activity  . Alcohol use: Yes  . Drug use: No  . Sexual activity: Not on file  Other Topics Concern  . Not on file  Social History Narrative  . Not on file   Social Determinants of Health   Financial Resource Strain:   . Difficulty of Paying Living Expenses: Not on file  Food Insecurity:   . Worried About Programme researcher, broadcasting/film/video in the Last Year: Not  on file  . Ran Out of Food in the Last Year: Not on file  Transportation Needs:   . Lack of Transportation (Medical): Not on file  . Lack of Transportation (Non-Medical): Not on file  Physical Activity:   . Days of Exercise per Week: Not on file  . Minutes of Exercise per Session: Not on file  Stress:   . Feeling of Stress : Not on file  Social Connections:   . Frequency of Communication with Friends and Family: Not on file  . Frequency of Social Gatherings with Friends and Family: Not on file  . Attends Religious Services: Not on file  . Active Member of Clubs or Organizations: Not on file  . Attends Banker Meetings: Not on file  . Marital Status: Not on file  Intimate Partner Violence:   . Fear of Current or Ex-Partner: Not on file  . Emotionally Abused:  Not on file  . Physically Abused: Not on file  . Sexually Abused: Not on file    Family History:   History reviewed. No pertinent family history.   ROS:  Please see the history of present illness.  All other ROS reviewed and negative.     Physical Exam/Data:   Vitals:   08/22/20 0825 08/22/20 0900 08/22/20 1100 08/22/20 1200  BP: 130/76 118/66 114/63 110/61  Pulse:  88 85 86  Resp: (!) 25 16 16 15   Temp:      TempSrc:      SpO2:  100% 100% 99%  Weight:      Height:        Intake/Output Summary (Last 24 hours) at 08/22/2020 1349 Last data filed at 08/22/2020 08/24/2020 Gross per 24 hour  Intake 700 ml  Output --  Net 700 ml   Last 3 Weights 08/21/2020 01/03/2020 11/24/2019  Weight (lbs) 110 lb 117 lb 116 lb 6.4 oz  Weight (kg) 49.896 kg 53.071 kg 52.799 kg     Body mass index is 18.3 kg/m.  General:  Well nourished, well developed, in no acute distress HEENT: normal Lymph: no adenopathy Neck: no JVD Endocrine:  No thryomegaly Vascular: No carotid bruits; FA pulses 2+ bilaterally without bruits  Cardiac:  normal S1, S2; Irreg Irreg; no murmur  Lungs:  clear to auscultation bilaterally, no wheezing,  rhonchi or rales  Abd: soft, nontender, no hepatomegaly  Ext: no edema Musculoskeletal:  No deformities, BUE and BLE strength normal and equal Skin: warm and dry  Neuro:  CNs 2-12 intact, no focal abnormalities noted Psych:  Normal affect   EKG:  The EKG was personally reviewed and demonstrates:  Afib, 120 bpm Telemetry:  Telemetry was personally reviewed and demonstrates:  Afib HR 80-90s, PVCs, q waves V1-V3  Relevant CV Studies:  Echo 08/22/20 1. Left ventricular ejection fraction, by estimation, is 60 to 65%. The  left ventricle has normal function. The left ventricle has no regional  wall motion abnormalities. There is moderate left ventricular hypertrophy.  Left ventricular diastolic  parameters are indeterminate.  2. Right ventricular systolic function is normal. The right ventricular  size is normal. There is normal pulmonary artery systolic pressure.  3. The mitral valve is normal in structure. Trivial mitral valve  regurgitation. No evidence of mitral stenosis.  4. The aortic valve is normal in structure. Aortic valve regurgitation is  not visualized. No aortic stenosis is present.  5. The inferior vena cava is normal in size with <50% respiratory  variability, suggesting right atrial pressure of 8 mmHg.   Laboratory Data:  High Sensitivity Troponin:  No results for input(s): TROPONINIHS in the last 720 hours.   Chemistry Recent Labs  Lab 08/21/20 2305 08/22/20 0314  NA 129* 130*  K 3.7 2.5*  CL 90* 94*  CO2 20* 20*  GLUCOSE 86 79  BUN 46* 40*  CREATININE 4.15* 3.27*  CALCIUM 7.4* 7.4*  GFRNONAA 10* 13*  GFRAA 11* 15*  ANIONGAP 19* 16*    Recent Labs  Lab 08/21/20 2305 08/22/20 0314  PROT 5.9* 5.6*  ALBUMIN 3.1* 2.7*  AST 169* 121*  ALT 96* 86*  ALKPHOS 78 69  BILITOT 1.5* 0.6   Hematology Recent Labs  Lab 08/21/20 2305 08/22/20 0314  WBC 16.4* 15.1*  RBC 3.04* 2.83*  2.97*  HGB 10.7* 10.0*  HCT 30.9* 29.2*  MCV 101.6* 103.2*  MCH  35.2* 35.3*  MCHC 34.6 34.2  RDW 14.5 14.5  PLT 273 296   BNPNo results for input(s): BNP, PROBNP in the last 168 hours.  DDimer No results for input(s): DDIMER in the last 168 hours.   Radiology/Studies:  CT ABDOMEN PELVIS WO CONTRAST  Result Date: 08/22/2020 CLINICAL DATA:  Loss of appetite, nausea, diarrhea for 5 days. History of abdominal pain. Bloody stools for 2 days. Suspected infectious gastroenteritis or colitis. EXAM: CT ABDOMEN AND PELVIS WITHOUT CONTRAST TECHNIQUE: Multidetector CT imaging of the abdomen and pelvis was performed following the standard protocol without IV contrast. COMPARISON:  CT abdomen dated 12/03/2019. FINDINGS: Lower chest: Mild bibasilar atelectasis/scarring. Hepatobiliary: Small layering stones and or sludge. No pericholecystic inflammation. Liver contours are slightly nodular suggesting underlying cirrhosis. Two punctate stones within the distal common bile duct. Two additional stones at the upper margin of the common bile duct, possibly within the the cystic duct, largest measuring 3 mm. Pancreas: Unremarkable. No pancreatic ductal dilatation or surrounding inflammatory changes. Spleen: Normal in size without focal abnormality. Adrenals/Urinary Tract: Adrenal glands appear normal. Kidneys are unremarkable without mass, stone or hydronephrosis. No ureteral or bladder calculi are identified. Bladder appears normal. Stomach/Bowel: No dilated large or small bowel loops. No convincing evidence of bowel wall inflammation. Stomach is unremarkable, partially decompressed limiting characterization of its walls. Appendix is not seen but there are no focal inflammatory changes about the cecum to suggest acute appendicitis. Vascular/Lymphatic: Advanced aortic atherosclerosis. No enlarged lymph nodes are seen. Reproductive: Presumed hysterectomy.  No adnexal mass. Other: No free fluid or abscess collection is seen. No free intraperitoneal air. Musculoskeletal: Chronic compression  fracture deformities at the thoracolumbar junction, with associated degenerative change, stable. No acute appearing osseous abnormality. IMPRESSION: 1. Choledocholithiasis, with 2 punctate stones located within the distal common bile duct, and an additional 3 mm stone within the upper CBD versus cystic duct. No evidence of acute cholecystitis but would consider confirmation with RIGHT upper quadrant ultrasound and/or nuclear medicine HIDA scan. 2. Cholelithiasis. 3. No bowel obstruction or evidence of bowel wall inflammation. No renal or ureteral calculi. 4. Cirrhotic-appearing liver. Aortic Atherosclerosis (ICD10-I70.0). Electronically Signed   By: Bary Richard M.D.   On: 08/22/2020 08:35   US RENAL  Result Date: 08/22/2020 CLINICAL DATA:  Initial evaluation for acute kidney injury. EXAM: RENAL / URINARY TRACT ULTRASOUND COMPLETE COMPARISON:  Prior CT from earlier the same day. FINDINGS: Right Kidney: Renal measurements: 10.8 x 3.3 x 4.3 cm = volume: 80.2 mL. Renal echogenicity within normal limits. No nephrolithiasis or hydronephrosis. No focal renal mass. Left Kidney: Renal measurements: 11.2 x 5.1 x 4.6 cm = volume: 137.6 mL. Evaluation left kidney somewhat limited by positioning and body habitus. Visualized renal parenchyma grossly within normal limits. No nephrolithiasis or hydronephrosis. No focal renal mass. Bladder: Appears normal for degree of bladder distention. Other: None. IMPRESSION: Normal renal ultrasound. No hydronephrosis or other significant finding. Electronically Signed   By: Rise Mu M.D.   On: 08/22/2020 04:17   DG Chest Portable 1 View  Result Date: 08/21/2020 CLINICAL DATA:  Weakness, anorexia EXAM: PORTABLE CHEST 1 VIEW COMPARISON:  12/03/2016 FINDINGS: The lungs are mildly hyperinflated suggesting changes of underlying COPD. The lungs are clear. No pneumothorax or pleural effusion. Cardiac size is within normal limits. Pulmonary vascularity normal. No acute bone  abnormality. IMPRESSION: No acute cardiopulmonary process.  COPD. Electronically Signed   By: Helyn Numbers MD   On: 08/21/2020 21:21   ECHOCARDIOGRAM COMPLETE  Result Date: 08/22/2020    ECHOCARDIOGRAM REPORT   Patient Name:  Erin Calderon Date of Exam: 08/22/2020 Medical Rec #:  [id number]     Height:       65.0 in Accession #:    [id number]    Weight:       110.0 lb Date of Birth:  [birth year]     BSA:          1.534 m Patient Age:    74 years      BP:           110/61 mmHg Patient Gender: F             HR:           86 bpm. Exam Location:  Inpatient Procedure: 2D Echo, Cardiac Doppler and Color Doppler Indications:    Atrial fibrillation  History:        Patient has no prior history of Echocardiogram examinations.                 COPD; Risk Factors:Dyslipidemia and Current Smoker. PAD.  Sonographer:    Hanifah Royse LudwigArthur Guy RDCS (AE) Referring Phys: [id number] VASUNDHRA RATHORE IMPRESSIONS  1. Left ventricular ejection fraction, by estimation, is 60 to 65%. The left ventricle has normal function. The left ventricle has no regional wall motion abnormalities. There is moderate left ventricular hypertrophy. Left ventricular diastolic parameters are indeterminate.  2. Right ventricular systolic function is normal. The right ventricular size is normal. There is normal pulmonary artery systolic pressure.  3. The mitral valve is normal in structure. Trivial mitral valve regurgitation. No evidence of mitral stenosis.  4. The aortic valve is normal in structure. Aortic valve regurgitation is not visualized. No aortic stenosis is present.  5. The inferior vena cava is normal in size with <50% respiratory variability, suggesting right atrial pressure of 8 mmHg. FINDINGS  Left Ventricle: Left ventricular ejection fraction, by estimation, is 60 to 65%. The left ventricle has normal function. The left ventricle has no regional wall motion abnormalities. The left ventricular internal cavity size was normal in size. There is  moderate  left ventricular hypertrophy. Left ventricular diastolic parameters are indeterminate. Indeterminate filling pressures. Right Ventricle: The right ventricular size is normal. No increase in right ventricular wall thickness. Right ventricular systolic function is normal. There is normal pulmonary artery systolic pressure. The tricuspid regurgitant velocity is 2.64 m/s, and  with an assumed right atrial pressure of 8 mmHg, the estimated right ventricular systolic pressure is 35.9 mmHg. Left Atrium: Left atrial size was normal in size. Right Atrium: Right atrial size was normal in size. Pericardium: There is no evidence of pericardial effusion. Mitral Valve: The mitral valve is normal in structure. There is moderate thickening of the mitral valve leaflet(s). Normal mobility of the mitral valve leaflets. Moderate mitral annular calcification. Trivial mitral valve regurgitation. No evidence of mitral valve stenosis. Tricuspid Valve: The tricuspid valve is normal in structure. Tricuspid valve regurgitation is mild . No evidence of tricuspid stenosis. Aortic Valve: The aortic valve is normal in structure.. There is severe thickening and severe calcifcation of the aortic valve. Aortic valve regurgitation is not visualized. No aortic stenosis is present. There is severe thickening of the aortic valve. There is severe calcifcation of the aortic valve. Aortic valve mean gradient measures 4.0 mmHg. Aortic valve peak gradient measures 6.6 mmHg. Aortic valve area, by VTI measures 1.28 cm. Pulmonic Valve: The pulmonic valve was normal in structure. Pulmonic valve regurgitation is not visualized. No evidence of pulmonic stenosis. Aorta: The aortic root is normal in  size and structure. Venous: The inferior vena cava is normal in size with less than 50% respiratory variability, suggesting right atrial pressure of 8 mmHg. IAS/Shunts: The interatrial septum appears to be lipomatous. No atrial level shunt detected by color flow  Doppler.  LEFT VENTRICLE PLAX 2D LVIDd:         3.70 cm LVIDs:         2.20 cm LV PW:         1.10 cm LV IVS:        1.40 cm LVOT diam:     1.70 cm LV SV:         33 LV SV Index:   21 LVOT Area:     2.27 cm  RIGHT VENTRICLE            IVC RV Basal diam:  2.90 cm    IVC diam: 1.90 cm RV S prime:     7.83 cm/s TAPSE (M-mode): 1.7 cm LEFT ATRIUM             Index       RIGHT ATRIUM           Index LA diam:        3.20 cm 2.09 cm/m  RA Area:     15.20 cm LA Vol (A2C):   29.5 ml 19.23 ml/m RA Volume:   35.50 ml  23.14 ml/m LA Vol (A4C):   39.7 ml 25.87 ml/m LA Biplane Vol: 37.9 ml 24.70 ml/m  AORTIC VALVE AV Area (Vmax):    1.28 cm AV Area (Vmean):   1.23 cm AV Area (VTI):     1.28 cm AV Vmax:           128.00 cm/s AV Vmean:          92.500 cm/s AV VTI:            0.255 m AV Peak Grad:      6.6 mmHg AV Mean Grad:      4.0 mmHg LVOT Vmax:         72.40 cm/s LVOT Vmean:        50.200 cm/s LVOT VTI:          0.144 m LVOT/AV VTI ratio: 0.56  AORTA Ao Root diam: 3.40 cm Ao Asc diam:  3.10 cm TRICUSPID VALVE TR Peak grad:   27.9 mmHg TR Vmax:        264.00 cm/s  SHUNTS Systemic VTI:  0.14 m Systemic Diam: 1.70 cm Chilton Si MD Electronically signed by Chilton Si MD Signature Date/Time: 08/22/2020/1:01:16 PM    Final     Assessment and Plan:   SIRS - presented with nausea, emesis, bloody diarrhea. WBC elevated on admission. Hgb 10.6. FOBT positive. LFTS elevated. IVF in the ER - IV abx per IM - antiemetics held due to prolonged Qt - CT abd/pelvis with possible choledocolithiasis - procal normal - GI following, plan for MRCP with possible colonoscopy vs flex sig  Acute anemia - suspected lower GI bleed/bloody diarrhea - Hgb today 10.7>10.0. Baseline around 13.  - GI following, plan as above  New onset Afib - Afib RVR on admission, rates up to 120, improved without intervention. Rates now in 80-90s, Pressures soft. In the setting of the above - CHADSVASC = 5 (female, HTN, PAD, age x 2) -  A/c held in the setting of GIB/anemia - echo today showed LVEF 60-65%, mod LVH, trivial MR - keep Mag >2 and K>4 - check TSH -  Need to resolve acute issues before starting a/c. Rates relatively well controlled however can start BB for better rate control. Continue telemetry. If afib persists might need to consider TEE/DCCV.   AKI - creatinine normal in November 202 - creatinine improving. 4.15>3.27 - Losartan and HCTZ held  Elevated LFTs - thought to be secondary to ongoing alcohol use  Hyponatremia/Hypokalemia - secondary to diarrhea/poor PO intake - HCTZ held - K+ low today at 2.5, supplement. Goal >4.  - daily BMET  Long QTc - QTc up to yesterday. QtC improving, today  - continue to correct electrolyte abnormalities. Keep mag >2 and K+>4 Avoid drugs that prolong QT interval   - daily EKG  HLD - pta rosuvastatin 20 mg - held with elevated LFTs  HTN - pta Lopresor 100 mg daily, losartan 100 mg, HCTZ 12.5 mg daily, amlodipine 5 mg daily - Losartan and HCTZ held for AKI/electrolyt abnormalities - amlodipine held for soft pressures - can restart BB for rate control  For questions or updates, please contact [facility] Please consult [email or web address] for contact info under    Signed, Cadence David Stall, PA-C  08/22/2020 1:49 PM   Patient seen and examined  I agree with findings as noted by Peggyann Juba above  Patient is a 74 yo with Hx of PAD and HTN who presents today for N/V and diarrhea  SOme bloody  Found to be in atrial fibrillation   Initially with rapid ventricular response   Now rates are controlled    The pt denies palpitations  No dizziness  No syncope   Breathing is OK  No CP    On exam, pt is in NAD  Neck:  JVP is normal  Lungs are relatively clear Cardiac Irreg irreg  No S3   No signif murmurs Abd is sl distended   + BS    Ext are without edema  Labs signif for K 2.4 earlier   AST/ALT 169/96  Lipase 57   Hgb 10.7 (Macrocytic)   EKG yesterday QT  prolonged (in setting of hypokalemia  Today it is improved but still a little long    Echo shows LVEf and RVEF are normal  No valvular dz  LA size  is  Normal  Atrial fib:  New diagnosis   Rates are now controlled  CHA2DS2VASc is 5    Hold anticoagulation for now until GI work up done  Check TSH   Keep on telemetry   Avoid drugs that prolong QT  WIll continue to follow   Dietrich Pates MD

## 2020-08-22 NOTE — Progress Notes (Signed)
  Echocardiogram 2D Echocardiogram has been performed.  Erin Calderon 08/22/2020, 12:24 PM

## 2020-08-22 NOTE — ED Notes (Signed)
Pt refused oral contrast. Pt states, " I do not want to poop my brains out." Pt counseled.

## 2020-08-22 NOTE — H&P (Signed)
History and Physical    Erin Calderon UEA:[id number] DOB: Apr 09, 1946 DOA: 08/21/2020  PCP: Aretta Nip, MD Patient coming from: Home  Chief Complaint: Multiple complaints  HPI: Erin Calderon is a 74 y.o. female with medical history significant of hypertension, hyperlipidemia, COPD, PVD, tobacco use presenting with complaints of nausea, vomiting, and bloody diarrhea.  Patient reports having nausea, nonbloody emesis, and diarrhea for the past 10 days.  States for the past 5 days the diarrhea has become bloody.  She had some abdominal pain previously and does not remember where it was located in her abdomen.  Denies lightheadedness/dizziness, chest pain, palpitations, or shortness of breath.  She takes aspirin 81 mg daily at home and no other blood thinners.  She has been vaccinated against Covid.  ED Course: Afebrile.  Found to be in A. fib with rate in the 90-110 range.  WBC count 16.4.  Hemoglobin 10.7, was 13.1 on labs done in January 2019.  No recent labs for comparison.  FOBT positive.  Platelet count normal.  No significant elevation of lipase.  LFTs elevated (AST 169, ALT 96, T bili 1.5).  Alk phos normal.  Sodium 129, potassium 3.7, chloride 90, bicarb 20, BUN 46, creatinine 4.1, and glucose 86.  Creatinine was 0.8 on labs done in November 2020.  Corrected calcium 8.1.  INR 0.9.  SARS-CoV-2 PCR test negative.  Chest x-ray showing changes consistent with underlying COPD and no acute cardiopulmonary process.  CT abdomen pelvis without contrast pending.  Patient received 1.5 L IV fluid boluses.  Review of Systems:  All systems reviewed and apart from history of presenting illness, are negative.  Past Medical History:  Diagnosis Date  . Hypertension     No past surgical history on file.   reports that she has been smoking cigarettes. She has been smoking about 0.50 packs per day. She has never used smokeless tobacco. She reports current alcohol use. She reports that she does not  use drugs.  Allergies  Allergen Reactions  . Hydrocodone Nausea And Vomiting  . Latex Itching and Rash    Reaction to latex gloves and rubber goods    History reviewed. No pertinent family history.  Prior to Admission medications   Medication Sig Start Date End Date Taking? Authorizing Provider  albuterol (PROVENTIL HFA;VENTOLIN HFA) 108 (90 Base) MCG/ACT inhaler Inhale 2 puffs into the lungs every 6 (six) hours as needed for wheezing or shortness of breath.   Yes [provider]  albuterol (PROVENTIL) (2.5 MG/3ML) 0.083% nebulizer solution Take 2.5 mg by nebulization every 6 (six) hours as needed for wheezing or shortness of breath.   Yes [provider]  amLODipine (NORVASC) 5 MG tablet Take 5 mg by mouth daily. 08/09/20  Yes [provider]  aspirin EC 81 MG tablet Take 81 mg by mouth daily.   Yes [provider]  Cyanocobalamin (VITAMIN B-12) 500 MCG SUBL Place 1,000 mcg under the tongue daily.   Yes [provider]  fluticasone (FLONASE) 50 MCG/ACT nasal spray Place 1-2 sprays into both nostrils daily as needed for allergies or rhinitis.   Yes [provider]  folic acid (FOLVITE) 478 MCG tablet Take 400 mcg by mouth daily.   Yes [provider]  hydrochlorothiazide (MICROZIDE) 12.5 MG capsule Take 12.5 mg by mouth daily.  11/18/19  Yes [provider]  losartan (COZAAR) 100 MG tablet Take 100 mg by mouth daily.  11/18/19  Yes [provider]  Multiple Vitamin (MULTIVITAMIN WITH  MINERALS) TABS tablet Take 1 tablet by mouth daily.   Yes [provider]  pantoprazole (PROTONIX) 40 MG tablet Take 1 tablet (40 mg total) by mouth daily. 12/03/16  Yes Tanda Rockers, MD  rosuvastatin (CRESTOR) 20 MG tablet Take 1 tablet (20 mg total) by mouth daily. 01/03/20 08/22/29 Yes Buford Dresser, MD  traMADol (ULTRAM) 50 MG tablet Take 50 mg by mouth every 6 (six) hours as needed for moderate pain.   Yes  [provider]  vitamin E 200 UNIT capsule Take 200 Units by mouth daily.   Yes [provider]  bisoprolol (ZEBETA) 10 MG tablet Take 1 tablet (10 mg total) by mouth daily. Patient not taking: Reported on 08/22/2020 12/03/16   Tanda Rockers, MD  metoprolol tartrate (LOPRESSOR) 100 MG tablet Take 2 hours prior to test. Patient not taking: Reported on 08/22/2020 11/24/19   Lorretta Harp, MD  potassium chloride (K-DUR) 10 MEQ tablet Take 1 tablet (10 mEq total) by mouth daily. Patient not taking: Reported on 08/22/2020 01/19/18   Milton Ferguson, MD    Physical Exam: Vitals:   08/22/20 0015 08/22/20 0130 08/22/20 0200 08/22/20 0245  BP: (!) 117/56 125/60 119/66 (!) 109/56  Pulse: 100 92 89 93  Resp: (!) _0 Temp:      TempSrc:      SpO2: 99% 90% 99% 94%  Weight:      Height:        Physical Exam Constitutional:      Appearance: She is ill-appearing.  HENT:     Head: Normocephalic and atraumatic.     Mouth/Throat:     Mouth: Mucous membranes are dry.  Eyes:     Extraocular Movements: Extraocular movements intact.     Conjunctiva/sclera: Conjunctivae normal.  Cardiovascular:     Rate and Rhythm: Tachycardia present. Rhythm irregular.     Pulses: Normal pulses.     Comments: Mildly tachycardic Pulmonary:     Effort: Pulmonary effort is normal. No respiratory distress.     Breath sounds: Normal breath sounds. No wheezing.  Abdominal:     General: Bowel sounds are normal. There is no distension.     Palpations: Abdomen is soft.     Tenderness: There is abdominal tenderness. There is no guarding or rebound.     Comments: Bilateral lower quadrants tender to palpation  Musculoskeletal:        General: No swelling or tenderness.     Cervical back: Normal range of motion and neck supple.  Skin:    General: Skin is warm and dry.  Neurological:     General: No focal deficit present.     Mental Status: She is alert and oriented to person, place, and  time.     Labs on Admission: I have personally reviewed following labs and imaging studies  CBC: Recent Labs  Lab 08/21/20 2305  WBC 16.4*  NEUTROABS 13.2*  HGB 10.7*  HCT 30.9*  MCV 101.6*  PLT 712   Basic Metabolic Panel: Recent Labs  Lab 08/21/20 2305  NA 129*  K 3.7  CL 90*  CO2 20*  GLUCOSE 86  BUN 46*  CREATININE 4.15*  CALCIUM 7.4*   GFR: Estimated Creatinine Clearance: 9.2 mL/min (A) (by C-G formula based on SCr of 4.15 mg/dL (H)). Liver Function Tests: Recent Labs  Lab 08/21/20 2305  AST 169*  ALT 96*  ALKPHOS 78  BILITOT 1.5*  PROT 5.9*  ALBUMIN 3.1*  Recent Labs  Lab 08/21/20 2305  LIPASE 56*   No results for input(s): AMMONIA in the last 168 hours. Coagulation Profile: Recent Labs  Lab 08/21/20 2305  INR 0.9   Cardiac Enzymes: No results for input(s): CKTOTAL, CKMB, CKMBINDEX, TROPONINI in the last 168 hours. BNP (last 3 results) No results for input(s): PROBNP in the last 8760 hours. HbA1C: No results for input(s): HGBA1C in the last 72 hours. CBG: No results for input(s): GLUCAP in the last 168 hours. Lipid Profile: No results for input(s): CHOL, HDL, LDLCALC, TRIG, CHOLHDL, LDLDIRECT in the last 72 hours. Thyroid Function Tests: No results for input(s): TSH, T4TOTAL, FREET4, T3FREE, THYROIDAB in the last 72 hours. Anemia Panel: No results for input(s): VITAMINB12, FOLATE, FERRITIN, TIBC, IRON, RETICCTPCT in the last 72 hours. Urine analysis: No results found for: COLORURINE, APPEARANCEUR, LABSPEC, PHURINE, GLUCOSEU, HGBUR, BILIRUBINUR, KETONESUR, PROTEINUR, UROBILINOGEN, NITRITE, LEUKOCYTESUR  Radiological Exams on Admission: DG Chest Portable 1 View  Result Date: 08/21/2020 CLINICAL DATA:  Weakness, anorexia EXAM: PORTABLE CHEST 1 VIEW COMPARISON:  12/03/2016 FINDINGS: The lungs are mildly hyperinflated suggesting changes of underlying COPD. The lungs are clear. No pneumothorax or pleural effusion. Cardiac size is within  normal limits. Pulmonary vascularity normal. No acute bone abnormality. IMPRESSION: No acute cardiopulmonary process.  COPD. Electronically Signed   By: Helyn Numbers MD   On: 08/21/2020 21:21    EKG: Independently reviewed.  A. fib, heart rate 96.  QTc 596.  Assessment/Plan Principal Problem:   Bloody diarrhea Active Problems:   Nausea and vomiting   SIRS (systemic inflammatory response syndrome) (HCC)   Acute blood loss anemia   AKI (acute kidney injury) (HCC)   Nausea, emesis, bloody diarrhea SIRS/ possible sepsis Afebrile.  Initially tachycardic and slightly tachypneic on arrival.  Labs showing leukocytosis with WBC count 16.4.  Hemoglobin 10.7, was 13.1 on previous labs done in January 2019.  No recent labs for comparison.  FOBT positive. No significant elevation of lipase.  LFTs elevated (AST 169, ALT 96, T bili 1.5).  Alk phos normal.  SARS-CoV-2 PCR test negative. -Received IV fluid boluses in the ED, continue IV fluid hydration.  Start Zosyn empirically for coverage of possible colitis.  No vomiting in the ED and currently not endorsing any nausea.  Hold antiemetics given significant QT prolongation on EKG.  Start CT abdomen pelvis pending.  Check procalcitonin level.  Check lactic acid level.  Order blood culture x2.  C. difficile PCR and GI pathogen panel.  Follow enteric precautions.  Monitor H&H, WBC count.  Acute blood loss anemia: In the setting of acute lower GI bleed/bloody diarrhea. -Type and screen.  Monitor H&H, transfuse if hemoglobin less than 7.  Order anemia panel.  New onset A. fib with RVR: Rate currently 90-100. Per review of prior cardiology notes, no documented history of A. Fib. -Cardiac monitoring.  Order echocardiogram. CHA2DS2VASc 5 - will hold off starting anticoagulation at this time due to concern for lower GI bleed.  Please consult cardiology in the morning.  AKI: Likely prerenal due to dehydration in the setting of poor oral intake/ diarrhea and home  diuretic plus ARB use..  BUN 46, creatinine 4.1.  Creatinine was 0.8 on labs done in November 2020.  No recent labs for comparison. -IV fluid hydration.  Monitor renal function and urine output.  Avoid nephrotoxic agents/contrast.  Hold home hydrochlorothiazide and losartan.  Order renal ultrasound.  Check urine sodium, creatinine.  Hyponatremia: Likely due to diarrhea and poor oral intake  in the setting of home thiazide diuretic use.  Sodium 129.  -IV fluid hydration and continue to monitor sodium level closely  Mild high anion gap metabolic acidosis: Likely due to AKI.  Bicarb 20, anion gap 19. -Continue IV fluid hydration and monitor metabolic panel  Mild hypocalcemia: Corrected calcium 8.1. -Calcium supplementation  QT prolongation on EKG: QTC 596. -Cardiac monitoring.  Keep potassium above 4 and magnesium above 2.  Avoid QT prolonging drugs.  Repeat EKG in a.m.  Hypertension: Blood pressure soft. -Hold antihypertensives at this time  COPD: Stable.  No signs of acute exacerbation at this time. -DuoNeb as needed  DVT prophylaxis: SCDs Code Status: Patient wishes to be DNR. Family Communication: No family available at this time. Disposition Plan: Status is: Inpatient  Remains inpatient appropriate because:Ongoing diagnostic testing needed not appropriate for outpatient work up, IV treatments appropriate due to intensity of illness or inability to take PO and Inpatient level of care appropriate due to severity of illness   Dispo: The patient is from: Home              Anticipated d/c is to: SNF              Anticipated d/c date is: > 3 days              Patient currently is not medically stable to d/c.  The medical decision making on this patient was of high complexity and the patient is at high risk for clinical deterioration, therefore this is a level 3 visit.  Shela Leff MD [facility]  If 7PM-7AM, please contact night-coverage [email or web address]  08/22/2020,  3:41 AM

## 2020-08-23 DIAGNOSIS — R6510 Systemic inflammatory response syndrome (SIRS) of non-infectious origin without acute organ dysfunction: Secondary | ICD-10-CM

## 2020-08-23 DIAGNOSIS — N179 Acute kidney failure, unspecified: Secondary | ICD-10-CM

## 2020-08-23 DIAGNOSIS — I480 Paroxysmal atrial fibrillation: Secondary | ICD-10-CM

## 2020-08-23 LAB — BASIC METABOLIC PANEL
Anion gap: 10 (ref 5–15)
BUN: 18 mg/dL (ref 8–23)
CO2: 22 mmol/L (ref 22–32)
Calcium: 8.2 mg/dL — ABNORMAL LOW (ref 8.9–10.3)
Chloride: 97 mmol/L — ABNORMAL LOW (ref 98–111)
Creatinine, Ser: 1.06 mg/dL — ABNORMAL HIGH (ref 0.44–1.00)
GFR calc Af Amer: 59 mL/min — ABNORMAL LOW (ref >60)
GFR calc non Af Amer: 51 mL/min — ABNORMAL LOW (ref >60)
Glucose, Bld: 142 mg/dL — ABNORMAL HIGH (ref 70–99)
Potassium: 3.7 mmol/L (ref 3.5–5.1)
Sodium: 129 mmol/L — ABNORMAL LOW (ref 135–145)

## 2020-08-23 LAB — COMPREHENSIVE METABOLIC PANEL
ALT: 82 U/L — ABNORMAL HIGH (ref 0–44)
AST: 111 U/L — ABNORMAL HIGH (ref 15–41)
Albumin: 2.5 g/dL — ABNORMAL LOW (ref 3.5–5.0)
Alkaline Phosphatase: 60 U/L (ref 38–126)
Anion gap: 18 — ABNORMAL HIGH (ref 5–15)
BUN: 29 mg/dL — ABNORMAL HIGH (ref 8–23)
CO2: 15 mmol/L — ABNORMAL LOW (ref 22–32)
Calcium: 7.7 mg/dL — ABNORMAL LOW (ref 8.9–10.3)
Chloride: 97 mmol/L — ABNORMAL LOW (ref 98–111)
Creatinine, Ser: 1.46 mg/dL — ABNORMAL HIGH (ref 0.44–1.00)
GFR calc Af Amer: 40 mL/min — ABNORMAL LOW (ref >60)
GFR calc non Af Amer: 35 mL/min — ABNORMAL LOW (ref >60)
Glucose, Bld: 69 mg/dL — ABNORMAL LOW (ref 70–99)
Potassium: 2.6 mmol/L — CL (ref 3.5–5.1)
Sodium: 130 mmol/L — ABNORMAL LOW (ref 135–145)
Total Bilirubin: 0.6 mg/dL (ref 0.3–1.2)
Total Protein: 5.1 g/dL — ABNORMAL LOW (ref 6.5–8.1)

## 2020-08-23 LAB — CBC
HCT: 29.9 % — ABNORMAL LOW (ref 36.0–46.0)
Hemoglobin: 10.3 g/dL — ABNORMAL LOW (ref 12.0–15.0)
MCH: 35.4 pg — ABNORMAL HIGH (ref 26.0–34.0)
MCHC: 34.4 g/dL (ref 30.0–36.0)
MCV: 102.7 fL — ABNORMAL HIGH (ref 80.0–100.0)
Platelets: 272 10*3/uL (ref 150–400)
RBC: 2.91 MIL/uL — ABNORMAL LOW (ref 3.87–5.11)
RDW: 14.5 % (ref 11.5–15.5)
WBC: 13.3 10*3/uL — ABNORMAL HIGH (ref 4.0–10.5)
nRBC: 0 % (ref 0.0–0.2)

## 2020-08-23 LAB — C DIFFICILE QUICK SCREEN W PCR REFLEX
C Diff antigen: NEGATIVE
C Diff interpretation: NOT DETECTED
C Diff toxin: NEGATIVE

## 2020-08-23 LAB — MAGNESIUM: Magnesium: 2.3 mg/dL (ref 1.7–2.4)

## 2020-08-23 MED ORDER — METOPROLOL TARTRATE 12.5 MG HALF TABLET
12.5000 mg | ORAL_TABLET | Freq: Two times a day (BID) | ORAL | Status: DC
  Administered 2020-08-23 – 2020-08-25 (×5): 12.5 mg via ORAL
  Filled 2020-08-23 (×5): qty 1

## 2020-08-23 MED ORDER — SACCHAROMYCES BOULARDII 250 MG PO CAPS
250.0000 mg | ORAL_CAPSULE | Freq: Two times a day (BID) | ORAL | Status: DC
  Administered 2020-08-23 – 2020-08-25 (×5): 250 mg via ORAL
  Filled 2020-08-23 (×5): qty 1

## 2020-08-23 MED ORDER — POTASSIUM CHLORIDE CRYS ER 20 MEQ PO TBCR
40.0000 meq | EXTENDED_RELEASE_TABLET | ORAL | Status: AC
  Administered 2020-08-23 (×3): 40 meq via ORAL
  Filled 2020-08-23 (×3): qty 2

## 2020-08-23 NOTE — Progress Notes (Signed)
[name] Gastroenterology Progress Note  Erin Calderon 74 y.o. 04-03-1946  CC:  Diarrhea, rectal bleeding  Subjective: Patient reports mild lower abdominal discomfort.  Denies any further nausea or vomiting.  Continues to have loose stools.  States her last bowel movement was this morning and did not contain any blood.  Reports feeling hungry and is looking forward to breakfast.  ROS : Review of Systems  Cardiovascular: Negative for chest pain and palpitations.  Gastrointestinal: Positive for abdominal pain and diarrhea. Negative for blood in stool, constipation, heartburn, melena, nausea and vomiting.    Objective: Vital signs in last 24 hours: Vitals:   08/23/20 0612 08/23/20 1030  BP: 105/68 (!) 107/58  Pulse: 71 76  Resp: 18 18  Temp: 98.3 F (36.8 C) (!) 97.4 F (36.3 C)  SpO2: 100% 100%    Physical Exam:  General:  Lethargic, thin, elderly, cooperative, no distress  Head:  Normocephalic, without obvious abnormality, atraumatic  Eyes:   Anicteric sclera, EOMs intact  Lungs:   Breathing comfortably on room air  Heart:  Regular rate with irregular rhythm (A fib)  Abdomen:   Soft, mild bilateral lower quadrant tenderness, no guarding or peritoneal signs  Extremities: Extremities normal, atraumatic, no  edema  Pulses: 2+ and symmetric    Lab Results: Recent Labs    08/22/20 1750 08/23/20 0350  NA 129* 130*  K 3.0* 2.6*  CL 97* 97*  CO2 18* 15*  GLUCOSE 96 69*  BUN 33* 29*  CREATININE 1.89* 1.46*  CALCIUM 7.6* 7.7*  MG 2.4 2.3   Recent Labs    08/22/20 0314 08/23/20 0350  AST 121* 111*  ALT 86* 82*  ALKPHOS 69 60  BILITOT 0.6 0.6  PROT 5.6* 5.1*  ALBUMIN 2.7* 2.5*   Recent Labs    08/21/20 2305 08/21/20 2305 08/22/20 0314 08/23/20 0350  WBC 16.4*   < > 15.1* 13.3*  NEUTROABS 13.2*  --   --   --   HGB 10.7*   < > 10.0* 10.3*  HCT 30.9*   < > 29.2* 29.9*  MCV 101.6*   < > 103.2* 102.7*  PLT 273   < > 296 272   < > = values in this interval not  displayed.   Recent Labs    08/21/20 2305  LABPROT 12.1  INR 0.9    Impression: Rectal bleeding, diarrhea: -Hemoglobin 10.3, stable  -C. Diff, GI pathogen panel not yet collected  Cirrhosis, MELD 26 as of 08/21/20 -T bili 0.6/AST 111/ALT 82/ALP 60 today - INR 0.9 -Platelets normal (272)  Severe hypokalemia: Potassium 2.6 today  AKI, improving: BUN 29/Cr 1.46 today today  Possible choledocholithiasis per noncontrast CT, though patient asymptomatic  New-onset A fib, rate controlled  Plan: Volume repletion and correction of hypokalemia and other electrolyte imbalances.  Await stool studies.  If negative, consider colonoscopy vs. Flex sig.  Once kidney function improves, plan for MRCP w/ contrast.  Continue to monitor H&H with transfusion as needed to maintain Hgb >7.  [facility] will follow.  Edrick Kins PA-C 08/23/2020, 10:57 AM  Contact #  [phone]

## 2020-08-23 NOTE — Progress Notes (Addendum)
Progress Note  Patient Name: Erin Calderon Date of Encounter: 08/23/2020  Primary Cardiologist:  Jodelle Red, MD  Subjective   Still w/ diarrhea, otherwise does not feel that bad.  Inpatient Medications    Scheduled Meds: . metoprolol tartrate  25 mg Oral BID   Continuous Infusions: . piperacillin-tazobactam (ZOSYN)  IV 3.375 g (08/23/20 0358)   PRN Meds: ipratropium-albuterol   Vital Signs    Vitals:   08/22/20 1829 08/22/20 2013 08/23/20 0216 08/23/20 0612  BP: (!) 97/54 98/65 100/70 105/68  Pulse: 81 69 70 71  Resp:  17 18 18   Temp: 97.9 F (36.6 C) 98.7 F (37.1 C) 98.3 F (36.8 C) 98.3 F (36.8 C)  TempSrc: Oral Oral Oral Oral  SpO2: 98% 100% 100% 100%  Weight:      Height:        Intake/Output Summary (Last 24 hours) at 08/23/2020 0647 Last data filed at 08/22/2020 08/24/2020 Gross per 24 hour  Intake 200 ml  Output --  Net 200 ml   Filed Weights   08/21/20 2106  Weight: 49.9 kg   Last Weight  Most recent update: 08/21/2020  9:09 PM   Weight  49.9 kg (110 lb)           Weight change:    Telemetry    Atrial fib, rate ok - Personally Reviewed  ECG    None today - Personally Reviewed  Physical Exam   General: Well developed, well nourished, female appearing in no acute distress. Head: Normocephalic, atraumatic.  Neck: Supple without bruits, JVD not elevated. Lungs:  Resp regular and unlabored, scattered dry rales Heart: Irreg R&R, S1, S2, no S3, S4, or murmur; no rub. Abdomen: Soft, tender, non-distended with active bowel sounds. No hepatomegaly. No rebound/guarding. No obvious abdominal masses. Extremities: No clubbing, cyanosis, no edema. Radial pulses are 2+ bilaterally. Neuro: Alert and oriented X 3. Moves all extremities spontaneously. Psych: Normal affect.  Labs    Hematology Recent Labs  Lab 08/21/20 2305 08/22/20 0314 08/23/20 0350  WBC 16.4* 15.1* 13.3*  RBC 3.04* 2.83*  2.97* 2.91*  HGB 10.7* 10.0* 10.3*  HCT  30.9* 29.2* 29.9*  MCV 101.6* 103.2* 102.7*  MCH 35.2* 35.3* 35.4*  MCHC 34.6 34.2 34.4  RDW 14.5 14.5 14.5  PLT 273 296 272    Chemistry Recent Labs  Lab 08/21/20 2305 08/22/20 0314 08/22/20 1750  NA 129* 130* 129*  K 3.7 2.5* 3.0*  CL 90* 94* 97*  CO2 20* 20* 18*  GLUCOSE 86 79 96  BUN 46* 40* 33*  CREATININE 4.15* 3.27* 1.89*  CALCIUM 7.4* 7.4* 7.6*  PROT 5.9* 5.6*  --   ALBUMIN 3.1* 2.7*  --   AST 169* 121*  --   ALT 96* 86*  --   ALKPHOS 78 69  --   BILITOT 1.5* 0.6  --   GFRNONAA 10* 13* 26*  GFRAA 11* 15* 30*  ANIONGAP 19* 16* 14    Magnesium  Date Value Ref Range Status  08/22/2020 2.4 1.7 - 2.4 mg/dL Final    Comment:    Performed at [facility], [address] [place], [place]  08/22/2020 1.3 (L) 1.7 - 2.4 mg/dL Final    Comment:    Performed at [facility], [address] [place], [place]   High Sensitivity Troponin:  No results for input(s): TROPONINIHS in the last 720 hours.    BNPNo results for input(s): BNP, PROBNP  in the last 168 hours.   Lab Results  Component Value Date   TSH 0.923 08/22/2020   No results found for: HGBA1C   Radiology    CT ABDOMEN PELVIS WO CONTRAST  Result Date: 08/22/2020 CLINICAL DATA:  Loss of appetite, nausea, diarrhea for 5 days. History of abdominal pain. Bloody stools for 2 days. Suspected infectious gastroenteritis or colitis. EXAM: CT ABDOMEN AND PELVIS WITHOUT CONTRAST TECHNIQUE: Multidetector CT imaging of the abdomen and pelvis was performed following the standard protocol without IV contrast. COMPARISON:  CT abdomen dated 12/03/2019. FINDINGS: Lower chest: Mild bibasilar atelectasis/scarring. Hepatobiliary: Small layering stones and or sludge. No pericholecystic inflammation. Liver contours are slightly nodular suggesting underlying cirrhosis. Two punctate stones within the distal common bile duct. Two additional stones at the upper margin of the common bile duct, possibly  within the the cystic duct, largest measuring 3 mm. Pancreas: Unremarkable. No pancreatic ductal dilatation or surrounding inflammatory changes. Spleen: Normal in size without focal abnormality. Adrenals/Urinary Tract: Adrenal glands appear normal. Kidneys are unremarkable without mass, stone or hydronephrosis. No ureteral or bladder calculi are identified. Bladder appears normal. Stomach/Bowel: No dilated large or small bowel loops. No convincing evidence of bowel wall inflammation. Stomach is unremarkable, partially decompressed limiting characterization of its walls. Appendix is not seen but there are no focal inflammatory changes about the cecum to suggest acute appendicitis. Vascular/Lymphatic: Advanced aortic atherosclerosis. No enlarged lymph nodes are seen. Reproductive: Presumed hysterectomy.  No adnexal mass. Other: No free fluid or abscess collection is seen. No free intraperitoneal air. Musculoskeletal: Chronic compression fracture deformities at the thoracolumbar junction, with associated degenerative change, stable. No acute appearing osseous abnormality. IMPRESSION: 1. Choledocholithiasis, with 2 punctate stones located within the distal common bile duct, and an additional 3 mm stone within the upper CBD versus cystic duct. No evidence of acute cholecystitis but would consider confirmation with RIGHT upper quadrant ultrasound and/or nuclear medicine HIDA scan. 2. Cholelithiasis. 3. No bowel obstruction or evidence of bowel wall inflammation. No renal or ureteral calculi. 4. Cirrhotic-appearing liver. Aortic Atherosclerosis (ICD10-I70.0). Electronically Signed   By: Bary Richard M.D.   On: 08/22/2020 08:35   US RENAL  Result Date: 08/22/2020 CLINICAL DATA:  Initial evaluation for acute kidney injury. EXAM: RENAL / URINARY TRACT ULTRASOUND COMPLETE COMPARISON:  Prior CT from earlier the same day. FINDINGS: Right Kidney: Renal measurements: 10.8 x 3.3 x 4.3 cm = volume: 80.2 mL. Renal echogenicity  within normal limits. No nephrolithiasis or hydronephrosis. No focal renal mass. Left Kidney: Renal measurements: 11.2 x 5.1 x 4.6 cm = volume: 137.6 mL. Evaluation left kidney somewhat limited by positioning and body habitus. Visualized renal parenchyma grossly within normal limits. No nephrolithiasis or hydronephrosis. No focal renal mass. Bladder: Appears normal for degree of bladder distention. Other: None. IMPRESSION: Normal renal ultrasound. No hydronephrosis or other significant finding. Electronically Signed   By: Rise Mu M.D.   On: 08/22/2020 04:17   DG Chest Portable 1 View  Result Date: 08/21/2020 CLINICAL DATA:  Weakness, anorexia EXAM: PORTABLE CHEST 1 VIEW COMPARISON:  12/03/2016 FINDINGS: The lungs are mildly hyperinflated suggesting changes of underlying COPD. The lungs are clear. No pneumothorax or pleural effusion. Cardiac size is within normal limits. Pulmonary vascularity normal. No acute bone abnormality. IMPRESSION: No acute cardiopulmonary process.  COPD. Electronically Signed   By: Helyn Numbers MD   On: 08/21/2020 21:21   ECHOCARDIOGRAM COMPLETE  Result Date: 08/22/2020    ECHOCARDIOGRAM REPORT   Patient Name:  Bertram GalaYNTHIA Andel Date of Exam: 08/22/2020 Medical Rec #:  [id number]     Height:       65.0 in Accession #:    [id number]    Weight:       110.0 lb Date of Birth:  Jun 04, 1946     BSA:          1.534 m Patient Age:    74 years      BP:           110/61 mmHg Patient Gender: F             HR:           86 bpm. Exam Location:  Inpatient Procedure: 2D Echo, Cardiac Doppler and Color Doppler Indications:    Atrial fibrillation  History:        Patient has no prior history of Echocardiogram examinations.                 COPD; Risk Factors:Dyslipidemia and Current Smoker. PAD.  Sonographer:    Ross LudwigArthur Guy RDCS (AE) Referring Phys: [id number] VASUNDHRA RATHORE IMPRESSIONS  1. Left ventricular ejection fraction, by estimation, is 60 to 65%. The left ventricle has normal function.  The left ventricle has no regional wall motion abnormalities. There is moderate left ventricular hypertrophy. Left ventricular diastolic parameters are indeterminate.  2. Right ventricular systolic function is normal. The right ventricular size is normal. There is normal pulmonary artery systolic pressure.  3. The mitral valve is normal in structure. Trivial mitral valve regurgitation. No evidence of mitral stenosis.  4. The aortic valve is normal in structure. Aortic valve regurgitation is not visualized. No aortic stenosis is present.  5. The inferior vena cava is normal in size with <50% respiratory variability, suggesting right atrial pressure of 8 mmHg. FINDINGS  Left Ventricle: Left ventricular ejection fraction, by estimation, is 60 to 65%. The left ventricle has normal function. The left ventricle has no regional wall motion abnormalities. The left ventricular internal cavity size was normal in size. There is  moderate left ventricular hypertrophy. Left ventricular diastolic parameters are indeterminate. Indeterminate filling pressures. Right Ventricle: The right ventricular size is normal. No increase in right ventricular wall thickness. Right ventricular systolic function is normal. There is normal pulmonary artery systolic pressure. The tricuspid regurgitant velocity is 2.64 m/s, and  with an assumed right atrial pressure of 8 mmHg, the estimated right ventricular systolic pressure is 35.9 mmHg. Left Atrium: Left atrial size was normal in size. Right Atrium: Right atrial size was normal in size. Pericardium: There is no evidence of pericardial effusion. Mitral Valve: The mitral valve is normal in structure. There is moderate thickening of the mitral valve leaflet(s). Normal mobility of the mitral valve leaflets. Moderate mitral annular calcification. Trivial mitral valve regurgitation. No evidence of mitral valve stenosis. Tricuspid Valve: The tricuspid valve is normal in structure. Tricuspid valve  regurgitation is mild . No evidence of tricuspid stenosis. Aortic Valve: The aortic valve is normal in structure.. There is severe thickening and severe calcifcation of the aortic valve. Aortic valve regurgitation is not visualized. No aortic stenosis is present. There is severe thickening of the aortic valve. There is severe calcifcation of the aortic valve. Aortic valve mean gradient measures 4.0 mmHg. Aortic valve peak gradient measures 6.6 mmHg. Aortic valve area, by VTI measures 1.28 cm. Pulmonic Valve: The pulmonic valve was normal in structure. Pulmonic valve regurgitation is not visualized. No evidence of pulmonic stenosis. Aorta: The aortic root is normal  in size and structure. Venous: The inferior vena cava is normal in size with less than 50% respiratory variability, suggesting right atrial pressure of 8 mmHg. IAS/Shunts: The interatrial septum appears to be lipomatous. No atrial level shunt detected by color flow Doppler.  LEFT VENTRICLE PLAX 2D LVIDd:         3.70 cm LVIDs:         2.20 cm LV PW:         1.10 cm LV IVS:        1.40 cm LVOT diam:     1.70 cm LV SV:         33 LV SV Index:   21 LVOT Area:     2.27 cm  RIGHT VENTRICLE            IVC RV Basal diam:  2.90 cm    IVC diam: 1.90 cm RV S prime:     7.83 cm/s TAPSE (M-mode): 1.7 cm LEFT ATRIUM             Index       RIGHT ATRIUM           Index LA diam:        3.20 cm 2.09 cm/m  RA Area:     15.20 cm LA Vol (A2C):   29.5 ml 19.23 ml/m RA Volume:   35.50 ml  23.14 ml/m LA Vol (A4C):   39.7 ml 25.87 ml/m LA Biplane Vol: 37.9 ml 24.70 ml/m  AORTIC VALVE AV Area (Vmax):    1.28 cm AV Area (Vmean):   1.23 cm AV Area (VTI):     1.28 cm AV Vmax:           128.00 cm/s AV Vmean:          92.500 cm/s AV VTI:            0.255 m AV Peak Grad:      6.6 mmHg AV Mean Grad:      4.0 mmHg LVOT Vmax:         72.40 cm/s LVOT Vmean:        50.200 cm/s LVOT VTI:          0.144 m LVOT/AV VTI ratio: 0.56  AORTA Ao Root diam: 3.40 cm Ao Asc diam:  3.10 cm  TRICUSPID VALVE TR Peak grad:   27.9 mmHg TR Vmax:        264.00 cm/s  SHUNTS Systemic VTI:  0.14 m Systemic Diam: 1.70 cm Chilton Si MD Electronically signed by Chilton Si MD Signature Date/Time: 08/22/2020/1:01:16 PM    Final    US Abdomen Limited RUQ  Result Date: 08/22/2020 CLINICAL DATA:  Nausea and diarrhea for the past 5 days. EXAM: ULTRASOUND ABDOMEN LIMITED RIGHT UPPER QUADRANT COMPARISON:  CT abdomen pelvis from same day. FINDINGS: Gallbladder: Small gallstones and sludge. No wall thickening visualized. No sonographic Murphy sign noted by sonographer. Common bile duct: Diameter: 4 mm, normal. Liver: No focal lesion identified. Within normal limits in parenchymal echogenicity. Portal vein is patent on color Doppler imaging with normal direction of blood flow towards the liver. Other: None. IMPRESSION: 1. No acute abnormality. 2. Chronic cholelithiasis and sludge. Electronically Signed   By: Obie Dredge M.D.   On: 08/22/2020 14:53     Cardiac Studies   ECHO:  08/22/2020 1. Left ventricular ejection fraction, by estimation, is 60 to 65%. The  left ventricle has normal function. The left ventricle has no regional  wall  motion abnormalities. There is moderate left ventricular hypertrophy.  Left ventricular diastolic  parameters are indeterminate.  2. Right ventricular systolic function is normal. The right ventricular  size is normal. There is normal pulmonary artery systolic pressure.  3. The mitral valve is normal in structure. Trivial mitral valve  regurgitation. No evidence of mitral stenosis.  4. The aortic valve is normal in structure. Aortic valve regurgitation is  not visualized. No aortic stenosis is present.  5. The inferior vena cava is normal in size with <50% respiratory  variability, suggesting right atrial pressure of 8 mmHg.   Patient Profile     74 y.o. female w/ hx tobacco use, HTN, peripheral vascular disease, HLD, COPD was admitted 08/24 w/ N&V,  bloody diarrhea, AKI, SIRS, low K+/Mg+, Atrial fib.   Assessment & Plan    1. New dx atrial fibrillation:  - CHA2DS2-VASc = 5 (age x 2, female, HTN, PAD) - w/ bloody diarrhea, no anticoagulation at this time - got one dose of Lopressor 25 mg yesterday, SBP 90s so did not get pm dose - will decrease to 12.5 mg bid and follow BP/HR - both atria normal in size, consider TEE/DCCV once pt can be anticoagulated  2. N&V, bloody diarrhea - per IM, GI, stool studies pending - on Zosyn  3. AKI:  - peak BUN/Cr 46/4.16, improving w/ hydration, now 33/1.89 - per IM  4. Electrolytes - K+ as low as 2.5, improving w/ supplement - s/p 7 runs KCl on 08/24, K+ 2.5>>3.0 - Mg supplemented and normalized - per IM  5. Anemia - macrocytic, had elevated MCV in 2019 when H&H normal - 2nd acute blood loss - per IM  Otherwise, per IM Principal Problem:   Bloody diarrhea Active Problems:   Nausea and vomiting   SIRS (systemic inflammatory response syndrome) (HCC)   Acute blood loss anemia   AKI (acute kidney injury) (HCC)    Signed, Theodore Demark , PA-C 6:47 AM 08/23/2020 Pager: [phone]

## 2020-08-23 NOTE — Progress Notes (Signed)
PROGRESS NOTE    Erin Calderon  MAU:[id number] DOB: May 23, 1946 DOA: 08/21/2020 PCP: Aretta Nip, MD    Brief Narrative:Erin Calderon is a 74 y.o. female with medical history significant of hypertension, hyperlipidemia, COPD, PVD, tobacco use presenting with complaints of nausea, vomiting, and bloody diarrhea.  Patient reports having nausea, nonbloody emesis, and diarrhea for the past 10 days.  States for the past 5 days the diarrhea has become bloody.  She had some abdominal pain previously and does not remember where it was located in her abdomen.  Denies lightheadedness/dizziness, chest pain, palpitations, or shortness of breath.  She takes aspirin 81 mg daily at home and no other blood thinners.  She has been vaccinated against Covid.  ED Course: Afebrile.  Found to be in A. fib with rate in the 90-110 range.  WBC count 16.4.  Hemoglobin 10.7, was 13.1 on labs done in January 2019.  No recent labs for comparison.  FOBT positive.  Platelet count normal.  No significant elevation of lipase.  LFTs elevated (AST 169, ALT 96, T bili 1.5).  Alk phos normal.  Sodium 129, potassium 3.7, chloride 90, bicarb 20, BUN 46, creatinine 4.1, and glucose 86.  Creatinine was 0.8 on labs done in November 2020.  Corrected calcium 8.1.  INR 0.9.  SARS-CoV-2 PCR test negative.  Chest x-ray showing changes consistent with underlying COPD and no acute cardiopulmonary process.  CT abdomen pelvis without contrast pending.  Patient received 1.5 L IV fluid boluses.  Assessment & Plan:   Principal Problem:   Bloody diarrhea Active Problems:   Nausea and vomiting   SIRS (systemic inflammatory response syndrome) (HCC)   Acute blood loss anemia   AKI (acute kidney injury) ([place])    #1 bloody diarrhea with nausea and vomiting-patient admitted with nausea vomiting and diarrhea. Patient was reported to have bloody diarrhea with FOBT positive. C. difficile panel not sent as stool was mixed with  urine. Patient continues with diarrhea. Lactic acid 1.3 Procalcitonin 4.87 White count 13.3 On Zosyn presumably for colitis but CT shows no evidence of bowel inflammation. GI following if stool studies negative considering colonoscopy versus flex sig. At the time of admission it was thought that patient had SIRS as she had leukocytosis with mild tachypnea and tachycardia.  She was afebrile.  This has been resolved.  So far there is no evidence of sepsis. Blood cultures negative so far. Start probiotics. #2 new onset A. fib with RVR-rate controlled on Lopressor.  Anticoagulation not started due to bloody diarrhea. TEE and cardioversion once patient is adequately anticoagulated. Patient started on Lopressor which is new for her.  Blood pressure soft.  #3 AKI almost resolved with IV fluids.  White count 1.46 down from 4.15.  #4 hyponatremia/hypokalemia replete aggressively.  Magnesium normal.  Sodium 130 on normal saline.  Potassium 2.6 replete aggressively.  Magnesium normal 2.3.  Recheck labs in a.m.  #5 history of essential hypertension blood pressure 107/58 on Lopressor.  She was on Norvasc, Cozaar, bisoprolol,prior to admission which is on hold along with HCTZ.  #6 history of COPD stable continue nebulizer  #7 cirrhosis with history of alcohol abuse  #8 gallstones/choledocholithiasis patient does not have any symptoms.  MRCP once kidney function improves.  CT abdomen and pelvis no bowel obstruction or evidence of bowel wall inflammation.  Cirrhotic appearing liver.  Cholelithiasis and choledocholithiasis with 2 punctuate stones located within the CBD and additional 3 mm stones within the upper CBD versus cystic duct.  No  evidence of acute cholecystitis.  #9 leukocytosis improving 13.3 down from 16.4.  Patient started on Zosyn for presumably for colitis/covering intra-abdominal.  Blood cultures negative.   Estimated body mass index is 18.3 kg/m as calculated from the following:    Height as of this encounter: '5\' 5"'  (1.651 m).   Weight as of this encounter: 49.9 kg.  DVT prophylaxis: SCD as patient is having bloody diarrhea  code Status: DO NOT RESUSCITATE Family Communication: None at bedside  disposition Plan:  Status is: Inpatient  Dispo: The patient is from: Home              Anticipated d/c is to: Home              Anticipated d/c date is: > 3 days              Patient currently is not medically stable to d/c.  Consultants: GI and cardiology  Procedures: None Antimicrobials Zosyn  Subjective: Patient resting in bed still continues to have loose stools.  However no stools collected yet to it being mixed with urine.  She denies any abdominal pain nausea vomiting.  Objective: Vitals:   08/22/20 2013 08/23/20 0216 08/23/20 0612 08/23/20 1030  BP: 98/65 100/70 105/68 (!) 107/58  Pulse: 69 70 71 76  Resp: '17 18 18 18  ' Temp: 98.7 F (37.1 C) 98.3 F (36.8 C) 98.3 F (36.8 C) (!) 97.4 F (36.3 C)  TempSrc: Oral Oral Oral Oral  SpO2: 100% 100% 100% 100%  Weight:      Height:       No intake or output data in the 24 hours ending 08/23/20 1120 Filed Weights   08/21/20 2106  Weight: 49.9 kg    Examination:  General exam: Appears calm and comfortable  Respiratory system: Clear to auscultation. Respiratory effort normal. Cardiovascular system: S1 & S2 heard, RRR. No JVD, murmurs, rubs, gallops or clicks. No pedal edema. Gastrointestinal system: Abdomen is nondistended, soft and nontender. No organomegaly or masses felt. Normal bowel sounds heard. Central nervous system: Alert and oriented. No focal neurological deficits. Extremities: Symmetric 5 x 5 power. Skin: No rashes, lesions or ulcers Psychiatry: Judgement and insight appear normal. Mood & affect appropriate.     Data Reviewed: I have personally reviewed following labs and imaging studies  CBC: Recent Labs  Lab 08/21/20 2305 08/22/20 0314 08/23/20 0350  WBC 16.4* 15.1* 13.3*   NEUTROABS 13.2*  --   --   HGB 10.7* 10.0* 10.3*  HCT 30.9* 29.2* 29.9*  MCV 101.6* 103.2* 102.7*  PLT 273 296 540   Basic Metabolic Panel: Recent Labs  Lab 08/21/20 2305 08/22/20 0314 08/22/20 1750 08/23/20 0350  NA 129* 130* 129* 130*  K 3.7 2.5* 3.0* 2.6*  CL 90* 94* 97* 97*  CO2 20* 20* 18* 15*  GLUCOSE 86 79 96 69*  BUN 46* 40* 33* 29*  CREATININE 4.15* 3.27* 1.89* 1.46*  CALCIUM 7.4* 7.4* 7.6* 7.7*  MG  --  1.3* 2.4 2.3   GFR: Estimated Creatinine Clearance: 26.2 mL/min (A) (by C-G formula based on SCr of 1.46 mg/dL (H)). Liver Function Tests: Recent Labs  Lab 08/21/20 2305 08/22/20 0314 08/23/20 0350  AST 169* 121* 111*  ALT 96* 86* 82*  ALKPHOS 78 69 60  BILITOT 1.5* 0.6 0.6  PROT 5.9* 5.6* 5.1*  ALBUMIN 3.1* 2.7* 2.5*   Recent Labs  Lab 08/21/20 2305  LIPASE 56*   No results for input(s): AMMONIA in the last  168 hours. Coagulation Profile: Recent Labs  Lab 08/21/20 2305  INR 0.9   Cardiac Enzymes: No results for input(s): CKTOTAL, CKMB, CKMBINDEX, TROPONINI in the last 168 hours. BNP (last 3 results) No results for input(s): PROBNP in the last 8760 hours. HbA1C: No results for input(s): HGBA1C in the last 72 hours. CBG: No results for input(s): GLUCAP in the last 168 hours. Lipid Profile: No results for input(s): CHOL, HDL, LDLCALC, TRIG, CHOLHDL, LDLDIRECT in the last 72 hours. Thyroid Function Tests: Recent Labs    08/22/20 1750  TSH 0.923   Anemia Panel: Recent Labs    08/22/20 0314  VITAMINB12 947*  FOLATE 34.5  FERRITIN 164  TIBC 223*  IRON 40  RETICCTPCT 2.3   Sepsis Labs: Recent Labs  Lab 08/22/20 0314 08/22/20 0453  PROCALCITON 4.87  --   LATICACIDVEN  --  1.3    Recent Results (from the past 240 hour(s))  SARS Coronavirus 2 by RT PCR (hospital order, performed in [facility] hospital lab) Nasopharyngeal Nasopharyngeal Swab     Status: None   Collection Time: 08/21/20 11:08 PM   Specimen: Nasopharyngeal Swab   Result Value Ref Range Status   SARS Coronavirus 2 NEGATIVE NEGATIVE Final    Comment: (NOTE) SARS-CoV-2 target nucleic acids are NOT DETECTED.  The SARS-CoV-2 RNA is generally detectable in upper and lower respiratory specimens during the acute phase of infection. The lowest concentration of SARS-CoV-2 viral copies this assay can detect is 250 copies / mL. A negative result does not preclude SARS-CoV-2 infection and should not be used as the sole basis for treatment or other patient management decisions.  A negative result may occur with improper specimen collection / handling, submission of specimen other than nasopharyngeal swab, presence of viral mutation(s) within the areas targeted by this assay, and inadequate number of viral copies (<250 copies / mL). A negative result must be combined with clinical observations, patient history, and epidemiological information.  Fact Sheet for Patients:   StrictlyIdeas.no  Fact Sheet for Healthcare Providers: BankingDealers.co.za  This test is not yet approved or  cleared by the [place] FDA and has been authorized for detection and/or diagnosis of SARS-CoV-2 by FDA under an Emergency Use Authorization (EUA).  This EUA will remain in effect (meaning this test can be used) for the duration of the COVID-19 declaration under Section 564(b)(1) of the Act, 21 U.S.C. section 360bbb-3(b)(1), unless the authorization is terminated or revoked sooner.  Performed at [facility], [place] [address], [place], [place] [address]   Culture, blood (routine x 2)     Status: None (Preliminary result)   Collection Time: 08/22/20  4:29 AM   Specimen: BLOOD  Result Value Ref Range Status   Specimen Description BLOOD SITE NOT SPECIFIED  Final   Special Requests   Final    BOTTLES DRAWN AEROBIC AND ANAEROBIC Blood Culture results may not be optimal due to an inadequate volume of blood received in  culture bottles   Culture   Final    NO GROWTH 1 DAY Performed at [facility], [place] [address] [place], [place] [address]    Report Status PENDING  Incomplete  Culture, blood (routine x 2)     Status: None (Preliminary result)   Collection Time: 08/22/20  4:34 AM   Specimen: BLOOD  Result Value Ref Range Status   Specimen Description BLOOD SITE NOT SPECIFIED  Final   Special Requests   Final    BOTTLES DRAWN  AEROBIC AND ANAEROBIC Blood Culture results may not be optimal due to an inadequate volume of blood received in culture bottles   Culture   Final    NO GROWTH 1 DAY Performed at [facility] [address], [place], [place] [address]    Report Status PENDING  Incomplete         Radiology Studies: CT ABDOMEN PELVIS WO CONTRAST  Result Date: 08/22/2020 CLINICAL DATA:  Loss of appetite, nausea, diarrhea for 5 days. History of abdominal pain. Bloody stools for 2 days. Suspected infectious gastroenteritis or colitis. EXAM: CT ABDOMEN AND PELVIS WITHOUT CONTRAST TECHNIQUE: Multidetector CT imaging of the abdomen and pelvis was performed following the standard protocol without IV contrast. COMPARISON:  CT abdomen dated 12/03/2019. FINDINGS: Lower chest: Mild bibasilar atelectasis/scarring. Hepatobiliary: Small layering stones and or sludge. No pericholecystic inflammation. Liver contours are slightly nodular suggesting underlying cirrhosis. Two punctate stones within the distal common bile duct. Two additional stones at the upper margin of the common bile duct, possibly within the the cystic duct, largest measuring 3 mm. Pancreas: Unremarkable. No pancreatic ductal dilatation or surrounding inflammatory changes. Spleen: Normal in size without focal abnormality. Adrenals/Urinary Tract: Adrenal glands appear normal. Kidneys are unremarkable without mass, stone or hydronephrosis. No ureteral or bladder calculi are identified. Bladder appears normal. Stomach/Bowel: No dilated large  or small bowel loops. No convincing evidence of bowel wall inflammation. Stomach is unremarkable, partially decompressed limiting characterization of its walls. Appendix is not seen but there are no focal inflammatory changes about the cecum to suggest acute appendicitis. Vascular/Lymphatic: Advanced aortic atherosclerosis. No enlarged lymph nodes are seen. Reproductive: Presumed hysterectomy.  No adnexal mass. Other: No free fluid or abscess collection is seen. No free intraperitoneal air. Musculoskeletal: Chronic compression fracture deformities at the thoracolumbar junction, with associated degenerative change, stable. No acute appearing osseous abnormality. IMPRESSION: 1. Choledocholithiasis, with 2 punctate stones located within the distal common bile duct, and an additional 3 mm stone within the upper CBD versus cystic duct. No evidence of acute cholecystitis but would consider confirmation with RIGHT upper quadrant ultrasound and/or nuclear medicine HIDA scan. 2. Cholelithiasis. 3. No bowel obstruction or evidence of bowel wall inflammation. No renal or ureteral calculi. 4. Cirrhotic-appearing liver. Aortic Atherosclerosis (ICD10-I70.0). Electronically Signed   By: Franki Cabot M.D.   On: 08/22/2020 08:35   US RENAL  Result Date: 08/22/2020 CLINICAL DATA:  Initial evaluation for acute kidney injury. EXAM: RENAL / URINARY TRACT ULTRASOUND COMPLETE COMPARISON:  Prior CT from earlier the same day. FINDINGS: Right Kidney: Renal measurements: 10.8 x 3.3 x 4.3 cm = volume: 80.2 mL. Renal echogenicity within normal limits. No nephrolithiasis or hydronephrosis. No focal renal mass. Left Kidney: Renal measurements: 11.2 x 5.1 x 4.6 cm = volume: 137.6 mL. Evaluation left kidney somewhat limited by positioning and body habitus. Visualized renal parenchyma grossly within normal limits. No nephrolithiasis or hydronephrosis. No focal renal mass. Bladder: Appears normal for degree of bladder distention. Other: None.  IMPRESSION: Normal renal ultrasound. No hydronephrosis or other significant finding. Electronically Signed   By: Jeannine Boga M.D.   On: 08/22/2020 04:17   DG Chest Portable 1 View  Result Date: 08/21/2020 CLINICAL DATA:  Weakness, anorexia EXAM: PORTABLE CHEST 1 VIEW COMPARISON:  12/03/2016 FINDINGS: The lungs are mildly hyperinflated suggesting changes of underlying COPD. The lungs are clear. No pneumothorax or pleural effusion. Cardiac size is within normal limits. Pulmonary vascularity normal. No acute bone abnormality. IMPRESSION: No acute cardiopulmonary process.  COPD. Electronically Signed   By: Fidela Salisbury MD   On: 08/21/2020 21:21   ECHOCARDIOGRAM COMPLETE  Result Date: 08/22/2020    ECHOCARDIOGRAM REPORT   Patient Name:   ZURIE PLATAS Date of Exam: 08/22/2020 Medical Rec #:  [id number]     Height:       65.0 in Accession #:    [id number]    Weight:       110.0 lb Date of Birth:  [birth year]     BSA:          1.534 m Patient Age:    50 years      BP:           110/61 mmHg Patient Gender: F             HR:           86 bpm. Exam Location:  Inpatient Procedure: 2D Echo, Cardiac Doppler and Color Doppler Indications:    Atrial fibrillation  History:        Patient has no prior history of Echocardiogram examinations.                 COPD; Risk Factors:Dyslipidemia and Current Smoker. PAD.  Sonographer:    Clayton Lefort RDCS (AE) Referring Phys: [id number] [place]  1. Left ventricular ejection fraction, by estimation, is 60 to 65%. The left ventricle has normal function. The left ventricle has no regional wall motion abnormalities. There is moderate left ventricular hypertrophy. Left ventricular diastolic parameters are indeterminate.  2. Right ventricular systolic function is normal. The right ventricular size is normal. There is normal pulmonary artery systolic pressure.  3. The mitral valve is normal in structure. Trivial mitral valve regurgitation. No evidence of mitral  stenosis.  4. The aortic valve is normal in structure. Aortic valve regurgitation is not visualized. No aortic stenosis is present.  5. The inferior vena cava is normal in size with <50% respiratory variability, suggesting right atrial pressure of 8 mmHg. FINDINGS  Left Ventricle: Left ventricular ejection fraction, by estimation, is 60 to 65%. The left ventricle has normal function. The left ventricle has no regional wall motion abnormalities. The left ventricular internal cavity size was normal in size. There is  moderate left ventricular hypertrophy. Left ventricular diastolic parameters are indeterminate. Indeterminate filling pressures. Right Ventricle: The right ventricular size is normal. No increase in right ventricular wall thickness. Right ventricular systolic function is normal. There is normal pulmonary artery systolic pressure. The tricuspid regurgitant velocity is 2.64 m/s, and  with an assumed right atrial pressure of 8 mmHg, the estimated right ventricular systolic pressure is 16.5 mmHg. Left Atrium: Left atrial size was normal in size. Right Atrium: Right atrial size was normal in size. Pericardium: There is no evidence of pericardial effusion. Mitral Valve: The mitral valve is normal in structure. There is moderate thickening of the mitral valve leaflet(s). Normal mobility of the mitral valve leaflets. Moderate mitral annular calcification. Trivial mitral valve regurgitation. No evidence of mitral valve stenosis. Tricuspid Valve: The tricuspid valve is normal in structure. Tricuspid valve regurgitation is mild . No evidence of tricuspid stenosis. Aortic Valve: The aortic valve is normal in structure.. There is severe thickening and severe calcifcation of the aortic valve. Aortic valve regurgitation is not visualized. No aortic stenosis is present. There is severe thickening of the aortic valve. There is severe calcifcation of the aortic valve. Aortic valve mean gradient measures 4.0 mmHg. Aortic  valve peak gradient measures 6.6 mmHg.  Aortic valve area, by VTI measures 1.28 cm. Pulmonic Valve: The pulmonic valve was normal in structure. Pulmonic valve regurgitation is not visualized. No evidence of pulmonic stenosis. Aorta: The aortic root is normal in size and structure. Venous: The inferior vena cava is normal in size with less than 50% respiratory variability, suggesting right atrial pressure of 8 mmHg. IAS/Shunts: The interatrial septum appears to be lipomatous. No atrial level shunt detected by color flow Doppler.  LEFT VENTRICLE PLAX 2D LVIDd:         3.70 cm LVIDs:         2.20 cm LV PW:         1.10 cm LV IVS:        1.40 cm LVOT diam:     1.70 cm LV SV:         33 LV SV Index:   21 LVOT Area:     2.27 cm  RIGHT VENTRICLE            IVC RV Basal diam:  2.90 cm    IVC diam: 1.90 cm RV S prime:     7.83 cm/s TAPSE (M-mode): 1.7 cm LEFT ATRIUM             Index       RIGHT ATRIUM           Index LA diam:        3.20 cm 2.09 cm/m  RA Area:     15.20 cm LA Vol (A2C):   29.5 ml 19.23 ml/m RA Volume:   35.50 ml  23.14 ml/m LA Vol (A4C):   39.7 ml 25.87 ml/m LA Biplane Vol: 37.9 ml 24.70 ml/m  AORTIC VALVE AV Area (Vmax):    1.28 cm AV Area (Vmean):   1.23 cm AV Area (VTI):     1.28 cm AV Vmax:           128.00 cm/s AV Vmean:          92.500 cm/s AV VTI:            0.255 m AV Peak Grad:      6.6 mmHg AV Mean Grad:      4.0 mmHg LVOT Vmax:         72.40 cm/s LVOT Vmean:        50.200 cm/s LVOT VTI:          0.144 m LVOT/AV VTI ratio: 0.56  AORTA Ao Root diam: 3.40 cm Ao Asc diam:  3.10 cm TRICUSPID VALVE TR Peak grad:   27.9 mmHg TR Vmax:        264.00 cm/s  SHUNTS Systemic VTI:  0.14 m Systemic Diam: 1.70 cm Skeet Latch MD Electronically signed by Skeet Latch MD Signature Date/Time: 08/22/2020/1:01:16 PM    Final    US Abdomen Limited RUQ  Result Date: 08/22/2020 CLINICAL DATA:  Nausea and diarrhea for the past 5 days. EXAM: ULTRASOUND ABDOMEN LIMITED RIGHT UPPER QUADRANT COMPARISON:   CT abdomen pelvis from same day. FINDINGS: Gallbladder: Small gallstones and sludge. No wall thickening visualized. No sonographic Murphy sign noted by sonographer. Common bile duct: Diameter: 4 mm, normal. Liver: No focal lesion identified. Within normal limits in parenchymal echogenicity. Portal vein is patent on color Doppler imaging with normal direction of blood flow towards the liver. Other: None. IMPRESSION: 1. No acute abnormality. 2. Chronic cholelithiasis and sludge. Electronically Signed   By: Titus Dubin M.D.   On: 08/22/2020 14:53  Scheduled Meds: . metoprolol tartrate  12.5 mg Oral BID  . potassium chloride  40 mEq Oral Q2H   Continuous Infusions: . piperacillin-tazobactam (ZOSYN)  IV 3.375 g (08/23/20 0358)     LOS: 1 day     Georgette Shell, MD 08/23/2020, 11:20 AM

## 2020-08-24 LAB — GASTROINTESTINAL PANEL BY PCR, STOOL (REPLACES STOOL CULTURE)

## 2020-08-24 LAB — CBC
HCT: 31.3 % — ABNORMAL LOW (ref 36.0–46.0)
Hemoglobin: 10.6 g/dL — ABNORMAL LOW (ref 12.0–15.0)
MCH: 34.8 pg — ABNORMAL HIGH (ref 26.0–34.0)
MCHC: 33.9 g/dL (ref 30.0–36.0)
MCV: 102.6 fL — ABNORMAL HIGH (ref 80.0–100.0)
Platelets: 318 10*3/uL (ref 150–400)
RBC: 3.05 MIL/uL — ABNORMAL LOW (ref 3.87–5.11)
RDW: 14.7 % (ref 11.5–15.5)
WBC: 7.4 10*3/uL (ref 4.0–10.5)
nRBC: 0 % (ref 0.0–0.2)

## 2020-08-24 LAB — COMPREHENSIVE METABOLIC PANEL
ALT: 109 U/L — ABNORMAL HIGH (ref 0–44)
AST: 118 U/L — ABNORMAL HIGH (ref 15–41)
Albumin: 3 g/dL — ABNORMAL LOW (ref 3.5–5.0)
Alkaline Phosphatase: 74 U/L (ref 38–126)
Anion gap: 11 (ref 5–15)
BUN: 10 mg/dL (ref 8–23)
CO2: 24 mmol/L (ref 22–32)
Calcium: 8.5 mg/dL — ABNORMAL LOW (ref 8.9–10.3)
Chloride: 98 mmol/L (ref 98–111)
Creatinine, Ser: 0.94 mg/dL (ref 0.44–1.00)
GFR calc Af Amer: >60 mL/min (ref >60)
GFR calc non Af Amer: 59 mL/min — ABNORMAL LOW (ref >60)
Glucose, Bld: 142 mg/dL — ABNORMAL HIGH (ref 70–99)
Potassium: 3.5 mmol/L (ref 3.5–5.1)
Sodium: 133 mmol/L — ABNORMAL LOW (ref 135–145)
Total Bilirubin: 0.3 mg/dL (ref 0.3–1.2)
Total Protein: 6.1 g/dL — ABNORMAL LOW (ref 6.5–8.1)

## 2020-08-24 MED ORDER — LOPERAMIDE HCL 2 MG PO CAPS: 2.0000 mg | ORAL_CAPSULE | Freq: Three times a day (TID) | ORAL | Status: DC | PRN

## 2020-08-24 NOTE — Progress Notes (Signed)
[facility] Progress Note  Erin Calderon 74 y.o. 02-16-1946  CC:  Diarrhea, rectal bleeding  Subjective: Patient not having any abdominal pain, nausea, or vomiting today.  Continues to have diarrhea and poor appetite.  States she has only had jello and does not feel like eating anything else.  States she wants to go home.  Per flow sheet, 4 total bowel movements yesterday (type 6 to type 7), all of which were green in color.    ROS : Review of Systems  Cardiovascular: Negative for chest pain and palpitations.  Gastrointestinal: Positive for diarrhea. Negative for abdominal pain, blood in stool, constipation, heartburn, melena, nausea and vomiting.    Objective: Vital signs in last 24 hours: Vitals:   08/23/20 2003 08/24/20 0513  BP: 112/60 114/64  Pulse: 80 76  Resp: 17 15  Temp: (!) 97.2 F (36.2 C) 97.7 F (36.5 C)  SpO2: 98% 96%    Physical Exam:  General:  Lethargic, thin, elderly, cooperative, no distress  Head:  Normocephalic, without obvious abnormality, atraumatic  Eyes:   Anicteric sclera, EOMs intact  Lungs:   Breathing comfortably on room air  Heart:  Regular rate with irregular rhythm (A fib)  Abdomen:   Soft, mild bilateral lower quadrant tenderness, no guarding or peritoneal signs  Extremities: Extremities normal, atraumatic, no  edema  Pulses: 2+ and symmetric    Lab Results: Recent Labs    08/22/20 1750 08/22/20 1750 08/23/20 0350 08/23/20 1900  NA 129*   < > 130* 129*  K 3.0*   < > 2.6* 3.7  CL 97*   < > 97* 97*  CO2 18*   < > 15* 22  GLUCOSE 96   < > 69* 142*  BUN 33*   < > 29* 18  CREATININE 1.89*   < > 1.46* 1.06*  CALCIUM 7.6*   < > 7.7* 8.2*  MG 2.4  --  2.3  --    < > = values in this interval not displayed.   Recent Labs    08/22/20 0314 08/23/20 0350  AST 121* 111*  ALT 86* 82*  ALKPHOS 69 60  BILITOT 0.6 0.6  PROT 5.6* 5.1*  ALBUMIN 2.7* 2.5*   Recent Labs    08/21/20 2305 08/21/20 2305 08/22/20 0314  08/23/20 0350  WBC 16.4*   < > 15.1* 13.3*  NEUTROABS 13.2*  --   --   --   HGB 10.7*   < > 10.0* 10.3*  HCT 30.9*   < > 29.2* 29.9*  MCV 101.6*   < > 103.2* 102.7*  PLT 273   < > 296 272   < > = values in this interval not displayed.   Recent Labs    08/21/20 2305  LABPROT 12.1  INR 0.9    Impression: Rectal bleeding, diarrhea: -Hemoglobin 10.3, stable  -C. Diff and GI pathogen panel negative  Cirrhosis, MELD 26 as of 08/21/20 -T bili 0.6/AST 111/ALT 82/ALP 60 today - INR 0.9 -Platelets normal (272)  Severe hypokalemia: improved.  K+ 3.7 today (2.6 yesterday)  AKI, improving: BUN 18/Cr 1.06 today today  Possible choledocholithiasis per noncontrast CT, though patient asymptomatic  New-onset A fib, rate controlled  Plan: Recommend immodium for diarrhea.    Dr. Dulce Sellar recommends holding off on colonoscopy at this time.  Unclear if patient would be able to tolerate prep at this time, and prep may worsen hypokalemia.    Once kidney function improves, plan for MRCP w/ contrast.  Continue to monitor H&H with transfusion as needed to maintain Hgb >7.  [facility] will follow.  Edrick Kins PA-C 08/24/2020, 9:05 AM  Contact #  [phone]

## 2020-08-24 NOTE — Progress Notes (Signed)
PROGRESS NOTE    Erin Calderon  WIO:[id number] DOB: [birth year] DOA: 08/21/2020 PCP: Aretta Nip, MD    Brief Narrative:Erin Calderon is a 74 y.o. female with medical history significant of hypertension, hyperlipidemia, COPD, PVD, tobacco use presenting with complaints of nausea, vomiting, and bloody diarrhea.  Patient reports having nausea, nonbloody emesis, and diarrhea for the past 10 days.  States for the past 5 days the diarrhea has become bloody.  She had some abdominal pain previously and does not remember where it was located in her abdomen.  Denies lightheadedness/dizziness, chest pain, palpitations, or shortness of breath.  She takes aspirin 81 mg daily at home and no other blood thinners.  She has been vaccinated against Covid.  ED Course: Afebrile.  Found to be in A. fib with rate in the 90-110 range.  WBC count 16.4.  Hemoglobin 10.7, was 13.1 on labs done in January 2019.  No recent labs for comparison.  FOBT positive.  Platelet count normal.  No significant elevation of lipase.  LFTs elevated (AST 169, ALT 96, T bili 1.5).  Alk phos normal.  Sodium 129, potassium 3.7, chloride 90, bicarb 20, BUN 46, creatinine 4.1, and glucose 86.  Creatinine was 0.8 on labs done in November 2020.  Corrected calcium 8.1.  INR 0.9.  SARS-CoV-2 PCR test negative.  Chest x-ray showing changes consistent with underlying COPD and no acute cardiopulmonary process.  CT abdomen pelvis without contrast pending.  Patient received 1.5 L IV fluid boluses.  Assessment & Plan:   Principal Problem:   Bloody diarrhea Active Problems:   Nausea and vomiting   SIRS (systemic inflammatory response syndrome) (HCC)   Acute blood loss anemia   AKI (acute kidney injury) ([place])    #1 bloody diarrhea with nausea and vomiting-patient admitted with nausea vomiting and diarrhea. Patient was reported to have bloody diarrhea with FOBT positive. C. difficile panel and GI panel has been negative.  Isolation  DC'd.  Continue Florastor.  Diarrhea improved.  Imodium as needed On Zosyn presumably for colitis but CT shows no evidence of bowel inflammation. At the time of admission it was thought that patient had SIRS as she had leukocytosis with mild tachypnea and tachycardia.  She was afebrile.  This has been resolved.  So far there is no evidence of sepsis. Blood cultures negative so far.  #2 new onset A. fib with RVR-rate controlled on Lopressor.  Anticoagulation not started due to bloody diarrhea. TEE and cardioversion once patient is adequately anticoagulated. Patient started on Lopressor which is new for her.  Blood pressure soft.  #3 AKI almost resolved with IV fluids.  Creatinine 1.4 down from 4.1 5 repeat labs in the a.m.   #4 hyponatremia/hypokalemia replete aggressively.  Magnesium normal.  Sodium 130 on normal saline.  Potassium 2.6 replete aggressively.  Magnesium normal 2.3.  Recheck labs in a.m.  #5 history of essential hypertension blood pressure 109/69  on Lopressor.  She was on Norvasc, Cozaar, bisoprolol,prior to admission which is on hold along with HCTZ.  #6 history of COPD stable continue nebulizer  #7 cirrhosis with history of alcohol abuse  #8 gallstones/choledocholithiasis patient does not have any symptoms.  MRCP once kidney function improves.  CT abdomen and pelvis no bowel obstruction or evidence of bowel wall inflammation.  Cirrhotic appearing liver.  Cholelithiasis and choledocholithiasis with 2 punctuate stones located within the CBD and additional 3 mm stones within the upper CBD versus cystic duct.  No evidence of acute cholecystitis.  #9  leukocytosis improving 13.3 down from 16.4.  Patient started on Zosyn for presumably for colitis/covering intra-abdominal.  Blood cultures negative.   Estimated body mass index is 18.3 kg/m as calculated from the following:   Height as of this encounter: 5' 5"  (1.651 m).   Weight as of this encounter: 49.9 kg.  DVT prophylaxis:  SCD as patient is having bloody diarrhea  code Status: DO NOT RESUSCITATE Family Communication: None at bedside  disposition Plan:  Status is: Inpatient  Dispo: The patient is from: Home              Anticipated d/c is to: Home              Anticipated d/c date is: > 3 days              Patient currently is not medically stable to d/c.  Consultants: GI and cardiology  Procedures: None Antimicrobials Zosyn  Subjective: Better less BMs no abdominal pain no nausea vomiting  Objective: Vitals:   08/23/20 1030 08/23/20 1300 08/23/20 2003 08/24/20 0513  BP: (!) 107/58 (!) 97/58 112/60 114/64  Pulse: 76 83 80 76  Resp: 18 18 17 15   Temp: (!) 97.4 F (36.3 C) 97.7 F (36.5 C) (!) 97.2 F (36.2 C) 97.7 F (36.5 C)  TempSrc: Oral Oral Oral Oral  SpO2: 100% 99% 98% 96%  Weight:      Height:        Intake/Output Summary (Last 24 hours) at 08/24/2020 1335 Last data filed at 08/23/2020 1945 Gross per 24 hour  Intake 120 ml  Output 150 ml  Net -30 ml   Filed Weights   08/21/20 2106  Weight: 49.9 kg    Examination:  General exam: Appears calm and comfortable  Respiratory system: Clear to auscultation. Respiratory effort normal. Cardiovascular system: S1 & S2 heard, RRR. No JVD, murmurs, rubs, gallops or clicks. No pedal edema. Gastrointestinal system: Abdomen is nondistended, soft and nontender. No organomegaly or masses felt. Normal bowel sounds heard. Central nervous system: Alert and oriented. No focal neurological deficits. Extremities: Symmetric 5 x 5 power. Skin: No rashes, lesions or ulcers Psychiatry: Judgement and insight appear normal. Mood & affect appropriate.     Data Reviewed: I have personally reviewed following labs and imaging studies  CBC: Recent Labs  Lab 08/21/20 2305 08/22/20 0314 08/23/20 0350  WBC 16.4* 15.1* 13.3*  NEUTROABS 13.2*  --   --   HGB 10.7* 10.0* 10.3*  HCT 30.9* 29.2* 29.9*  MCV 101.6* 103.2* 102.7*  PLT 273 296 465   Basic  Metabolic Panel: Recent Labs  Lab 08/21/20 2305 08/22/20 0314 08/22/20 1750 08/23/20 0350 08/23/20 1900  NA 129* 130* 129* 130* 129*  K 3.7 2.5* 3.0* 2.6* 3.7  CL 90* 94* 97* 97* 97*  CO2 20* 20* 18* 15* 22  GLUCOSE 86 79 96 69* 142*  BUN 46* 40* 33* 29* 18  CREATININE 4.15* 3.27* 1.89* 1.46* 1.06*  CALCIUM 7.4* 7.4* 7.6* 7.7* 8.2*  MG  --  1.3* 2.4 2.3  --    GFR: Estimated Creatinine Clearance: 36.1 mL/min (A) (by C-G formula based on SCr of 1.06 mg/dL (H)). Liver Function Tests: Recent Labs  Lab 08/21/20 2305 08/22/20 0314 08/23/20 0350  AST 169* 121* 111*  ALT 96* 86* 82*  ALKPHOS 78 69 60  BILITOT 1.5* 0.6 0.6  PROT 5.9* 5.6* 5.1*  ALBUMIN 3.1* 2.7* 2.5*   Recent Labs  Lab 08/21/20 2305  LIPASE 56*  No results for input(s): AMMONIA in the last 168 hours. Coagulation Profile: Recent Labs  Lab 08/21/20 2305  INR 0.9   Cardiac Enzymes: No results for input(s): CKTOTAL, CKMB, CKMBINDEX, TROPONINI in the last 168 hours. BNP (last 3 results) No results for input(s): PROBNP in the last 8760 hours. HbA1C: No results for input(s): HGBA1C in the last 72 hours. CBG: No results for input(s): GLUCAP in the last 168 hours. Lipid Profile: No results for input(s): CHOL, HDL, LDLCALC, TRIG, CHOLHDL, LDLDIRECT in the last 72 hours. Thyroid Function Tests: Recent Labs    08/22/20 1750  TSH 0.923   Anemia Panel: Recent Labs    08/22/20 0314  VITAMINB12 947*  FOLATE 34.5  FERRITIN 164  TIBC 223*  IRON 40  RETICCTPCT 2.3   Sepsis Labs: Recent Labs  Lab 08/22/20 0314 08/22/20 0453  PROCALCITON 4.87  --   LATICACIDVEN  --  1.3    Recent Results (from the past 240 hour(s))  SARS Coronavirus 2 by RT PCR (hospital order, performed in [facility] hospital lab) Nasopharyngeal Nasopharyngeal Swab     Status: None   Collection Time: 08/21/20 11:08 PM   Specimen: Nasopharyngeal Swab  Result Value Ref Range Status   SARS Coronavirus 2 NEGATIVE NEGATIVE Final     Comment: (NOTE) SARS-CoV-2 target nucleic acids are NOT DETECTED.  The SARS-CoV-2 RNA is generally detectable in upper and lower respiratory specimens during the acute phase of infection. The lowest concentration of SARS-CoV-2 viral copies this assay can detect is 250 copies / mL. A negative result does not preclude SARS-CoV-2 infection and should not be used as the sole basis for treatment or other patient management decisions.  A negative result may occur with improper specimen collection / handling, submission of specimen other than nasopharyngeal swab, presence of viral mutation(s) within the areas targeted by this assay, and inadequate number of viral copies (<250 copies / mL). A negative result must be combined with clinical observations, patient history, and epidemiological information.  Fact Sheet for Patients:   StrictlyIdeas.no  Fact Sheet for Healthcare Providers: BankingDealers.co.za  This test is not yet approved or  cleared by the [place] FDA and has been authorized for detection and/or diagnosis of SARS-CoV-2 by FDA under an Emergency Use Authorization (EUA).  This EUA will remain in effect (meaning this test can be used) for the duration of the COVID-19 declaration under Section 564(b)(1) of the Act, 21 U.S.C. section 360bbb-3(b)(1), unless the authorization is terminated or revoked sooner.  Performed at [facility], [place] [address], [place], [place] [address]   Culture, blood (routine x 2)     Status: None (Preliminary result)   Collection Time: 08/22/20  4:29 AM   Specimen: BLOOD  Result Value Ref Range Status   Specimen Description BLOOD SITE NOT SPECIFIED  Final   Special Requests   Final    BOTTLES DRAWN AEROBIC AND ANAEROBIC Blood Culture results may not be optimal due to an inadequate volume of blood received in culture bottles   Culture   Final    NO GROWTH 2 DAYS Performed at [facility], [place] [address], [place], [place] [address]    Report Status PENDING  Incomplete  Culture, blood (routine x 2)     Status: None (Preliminary result)   Collection Time: 08/22/20  4:34 AM   Specimen: BLOOD  Result Value Ref Range Status   Specimen Description BLOOD SITE NOT SPECIFIED  Final   Special Requests  Final    BOTTLES DRAWN AEROBIC AND ANAEROBIC Blood Culture results may not be optimal due to an inadequate volume of blood received in culture bottles   Culture   Final    NO GROWTH 2 DAYS Performed at [facility], [place] [address] [place], [place] [address]    Report Status PENDING  Incomplete  C Difficile Quick Screen w PCR reflex     Status: None   Collection Time: 08/23/20 12:10 PM   Specimen: STOOL  Result Value Ref Range Status   C Diff antigen NEGATIVE NEGATIVE Final   C Diff toxin NEGATIVE NEGATIVE Final   C Diff interpretation No C. difficile detected.  Final    Comment: Performed at [facility], [place] [address] [place], [place] [address]  Gastrointestinal Panel by PCR , Stool     Status: None   Collection Time: 08/23/20 12:10 PM   Specimen: Stool  Result Value Ref Range Status   Campylobacter species NOT DETECTED NOT DETECTED Final   Plesimonas shigelloides NOT DETECTED NOT DETECTED Final   Salmonella species NOT DETECTED NOT DETECTED Final   Yersinia enterocolitica NOT DETECTED NOT DETECTED Final   Vibrio species NOT DETECTED NOT DETECTED Final   Vibrio cholerae NOT DETECTED NOT DETECTED Final   Enteroaggregative E coli (EAEC) NOT DETECTED NOT DETECTED Final   Enteropathogenic E coli (EPEC) NOT DETECTED NOT DETECTED Final   Enterotoxigenic E coli (ETEC) NOT DETECTED NOT DETECTED Final   Shiga like toxin producing E coli (STEC) NOT DETECTED NOT DETECTED Final   Shigella/Enteroinvasive E coli (EIEC) NOT DETECTED NOT DETECTED Final   Cryptosporidium NOT DETECTED NOT DETECTED Final   Cyclospora cayetanensis NOT DETECTED NOT DETECTED Final    Entamoeba histolytica NOT DETECTED NOT DETECTED Final   Giardia lamblia NOT DETECTED NOT DETECTED Final   Adenovirus F40/41 NOT DETECTED NOT DETECTED Final   Astrovirus NOT DETECTED NOT DETECTED Final   Norovirus GI/GII NOT DETECTED NOT DETECTED Final   Rotavirus A NOT DETECTED NOT DETECTED Final   Sapovirus (I, II, IV, and V) NOT DETECTED NOT DETECTED Final    Comment: Performed at [facility], [address], [place],  [address]         Radiology Studies: US Abdomen Limited RUQ  Result Date: 08/22/2020 CLINICAL DATA:  Nausea and diarrhea for the past 5 days. EXAM: ULTRASOUND ABDOMEN LIMITED RIGHT UPPER QUADRANT COMPARISON:  CT abdomen pelvis from same day. FINDINGS: Gallbladder: Small gallstones and sludge. No wall thickening visualized. No sonographic Murphy sign noted by sonographer. Common bile duct: Diameter: 4 mm, normal. Liver: No focal lesion identified. Within normal limits in parenchymal echogenicity. Portal vein is patent on color Doppler imaging with normal direction of blood flow towards the liver. Other: None. IMPRESSION: 1. No acute abnormality. 2. Chronic cholelithiasis and sludge. Electronically Signed   By: Titus Dubin M.D.   On: 08/22/2020 14:53        Scheduled Meds: . metoprolol tartrate  12.5 mg Oral BID  . saccharomyces boulardii  250 mg Oral BID   Continuous Infusions: . piperacillin-tazobactam (ZOSYN)  IV 3.375 g (08/24/20 1253)     LOS: 2 days     Georgette Shell, MD 08/24/2020, 1:35 PM

## 2020-08-24 NOTE — Progress Notes (Signed)
Progress Note  Patient Name: Erin Calderon Date of Encounter: 08/24/2020  Primary Cardiologist:  Jodelle Red, MD  Subjective   Feels weak, no diarrhea yet today, has not "eaten" yet  Inpatient Medications    Scheduled Meds: . metoprolol tartrate  12.5 mg Oral BID  . saccharomyces boulardii  250 mg Oral BID   Continuous Infusions: . piperacillin-tazobactam (ZOSYN)  IV 3.375 g (08/24/20 0511)   PRN Meds: ipratropium-albuterol   Vital Signs    Vitals:   08/23/20 1030 08/23/20 1300 08/23/20 2003 08/24/20 0513  BP: (!) 107/58 (!) 97/58 112/60 114/64  Pulse: 76 83 80 76  Resp: 18 18 17 15   Temp: (!) 97.4 F (36.3 C) 97.7 F (36.5 C) (!) 97.2 F (36.2 C) 97.7 F (36.5 C)  TempSrc: Oral Oral Oral Oral  SpO2: 100% 99% 98% 96%  Weight:      Height:        Intake/Output Summary (Last 24 hours) at 08/24/2020 0749 Last data filed at 08/23/2020 1945 Gross per 24 hour  Intake 520 ml  Output 450 ml  Net 70 ml   Filed Weights   08/21/20 2106  Weight: 49.9 kg   Last Weight  Most recent update: 08/21/2020  9:09 PM   Weight  49.9 kg (110 lb)           Weight change:    Telemetry    Afib, controlled rate - Personally Reviewed  ECG    None today - Personally Reviewed  Physical Exam   GEN: No acute distress.   Neck: No JVD Cardiac: Irreg R&R, no murmur, no rubs, or gallops.  Respiratory: diminished to auscultation bilaterally   GI: Soft, nontender, non-distended  MS: No edema; No deformity. Neuro:  Nonfocal  Psych: Normal affect   Labs    Hematology Recent Labs  Lab 08/21/20 2305 08/22/20 0314 08/23/20 0350  WBC 16.4* 15.1* 13.3*  RBC 3.04* 2.83*  2.97* 2.91*  HGB 10.7* 10.0* 10.3*  HCT 30.9* 29.2* 29.9*  MCV 101.6* 103.2* 102.7*  MCH 35.2* 35.3* 35.4*  MCHC 34.6 34.2 34.4  RDW 14.5 14.5 14.5  PLT 273 296 272    Chemistry Recent Labs  Lab 08/21/20 2305 08/21/20 2305 08/22/20 0314 08/22/20 0314 08/22/20 1750 08/23/20 0350  08/23/20 1900  NA 129*   < > 130*   < > 129* 130* 129*  K 3.7   < > 2.5*   < > 3.0* 2.6* 3.7  CL 90*   < > 94*   < > 97* 97* 97*  CO2 20*   < > 20*   < > 18* 15* 22  GLUCOSE 86   < > 79   < > 96 69* 142*  BUN 46*   < > 40*   < > 33* 29* 18  CREATININE 4.15*   < > 3.27*   < > 1.89* 1.46* 1.06*  CALCIUM 7.4*   < > 7.4*   < > 7.6* 7.7* 8.2*  PROT 5.9*  --  5.6*  --   --  5.1*  --   ALBUMIN 3.1*  --  2.7*  --   --  2.5*  --   AST 169*  --  121*  --   --  111*  --   ALT 96*  --  86*  --   --  82*  --   ALKPHOS 78  --  69  --   --  60  --   BILITOT  1.5*  --  0.6  --   --  0.6  --   GFRNONAA 10*   < > 13*   < > 26* 35* 51*  GFRAA 11*   < > 15*   < > 30* 40* 59*  ANIONGAP 19*   < > 16*   < > 14 18* 10   < > = values in this interval not displayed.    Magnesium  Date Value Ref Range Status  08/23/2020 2.3 1.7 - 2.4 mg/dL Final    Comment:    Performed at [facility], [address] [place], [place] [address]  08/22/2020 2.4 1.7 - 2.4 mg/dL Final    Comment:    Performed at [facility], [address] [place], [place] [address]  08/22/2020 1.3 (L) 1.7 - 2.4 mg/dL Final    Comment:    Performed at [facility], [address] [place], [place] [address]   High Sensitivity Troponin:  No results for input(s): TROPONINIHS in the last 720 hours.    BNPNo results for input(s): BNP, PROBNP in the last 168 hours.   Lab Results  Component Value Date   TSH 0.923 08/22/2020   No results found for: HGBA1C   Radiology    CT ABDOMEN PELVIS WO CONTRAST  Result Date: 08/22/2020 CLINICAL DATA:  Loss of appetite, nausea, diarrhea for 5 days. History of abdominal pain. Bloody stools for 2 days. Suspected infectious gastroenteritis or colitis. EXAM: CT ABDOMEN AND PELVIS WITHOUT CONTRAST TECHNIQUE: Multidetector CT imaging of the abdomen and pelvis was performed following the standard protocol without IV contrast. COMPARISON:  CT abdomen dated 12/03/2019. FINDINGS: Lower  chest: Mild bibasilar atelectasis/scarring. Hepatobiliary: Small layering stones and or sludge. No pericholecystic inflammation. Liver contours are slightly nodular suggesting underlying cirrhosis. Two punctate stones within the distal common bile duct. Two additional stones at the upper margin of the common bile duct, possibly within the the cystic duct, largest measuring 3 mm. Pancreas: Unremarkable. No pancreatic ductal dilatation or surrounding inflammatory changes. Spleen: Normal in size without focal abnormality. Adrenals/Urinary Tract: Adrenal glands appear normal. Kidneys are unremarkable without mass, stone or hydronephrosis. No ureteral or bladder calculi are identified. Bladder appears normal. Stomach/Bowel: No dilated large or small bowel loops. No convincing evidence of bowel wall inflammation. Stomach is unremarkable, partially decompressed limiting characterization of its walls. Appendix is not seen but there are no focal inflammatory changes about the cecum to suggest acute appendicitis. Vascular/Lymphatic: Advanced aortic atherosclerosis. No enlarged lymph nodes are seen. Reproductive: Presumed hysterectomy.  No adnexal mass. Other: No free fluid or abscess collection is seen. No free intraperitoneal air. Musculoskeletal: Chronic compression fracture deformities at the thoracolumbar junction, with associated degenerative change, stable. No acute appearing osseous abnormality. IMPRESSION: 1. Choledocholithiasis, with 2 punctate stones located within the distal common bile duct, and an additional 3 mm stone within the upper CBD versus cystic duct. No evidence of acute cholecystitis but would consider confirmation with RIGHT upper quadrant ultrasound and/or nuclear medicine HIDA scan. 2. Cholelithiasis. 3. No bowel obstruction or evidence of bowel wall inflammation. No renal or ureteral calculi. 4. Cirrhotic-appearing liver. Aortic Atherosclerosis (ICD10-I70.0). Electronically Signed   By: Bary RichardStan   Maynard M.D.   On: 08/22/2020 08:35   US RENAL  Result Date: 08/22/2020 CLINICAL DATA:  Initial evaluation for acute kidney injury. EXAM: RENAL / URINARY TRACT ULTRASOUND COMPLETE COMPARISON:  Prior CT from earlier the same day. FINDINGS: Right Kidney: Renal measurements: 10.8 x  3.3 x 4.3 cm = volume: 80.2 mL. Renal echogenicity within normal limits. No nephrolithiasis or hydronephrosis. No focal renal mass. Left Kidney: Renal measurements: 11.2 x 5.1 x 4.6 cm = volume: 137.6 mL. Evaluation left kidney somewhat limited by positioning and body habitus. Visualized renal parenchyma grossly within normal limits. No nephrolithiasis or hydronephrosis. No focal renal mass. Bladder: Appears normal for degree of bladder distention. Other: None. IMPRESSION: Normal renal ultrasound. No hydronephrosis or other significant finding. Electronically Signed   By: Rise Mu M.D.   On: 08/22/2020 04:17   DG Chest Portable 1 View  Result Date: 08/21/2020 CLINICAL DATA:  Weakness, anorexia EXAM: PORTABLE CHEST 1 VIEW COMPARISON:  12/03/2016 FINDINGS: The lungs are mildly hyperinflated suggesting changes of underlying COPD. The lungs are clear. No pneumothorax or pleural effusion. Cardiac size is within normal limits. Pulmonary vascularity normal. No acute bone abnormality. IMPRESSION: No acute cardiopulmonary process.  COPD. Electronically Signed   By: Helyn Numbers MD   On: 08/21/2020 21:21   ECHOCARDIOGRAM COMPLETE  Result Date: 08/22/2020    ECHOCARDIOGRAM REPORT   Patient Name:   RONETTE HANK Date of Exam: 08/22/2020 Medical Rec #:  [id number]     Height:       65.0 in Accession #:    [id number]    Weight:       110.0 lb Date of Birth:  1946/10/05     BSA:          1.534 m Patient Age:    74 years      BP:           110/61 mmHg Patient Gender: F             HR:           86 bpm. Exam Location:  Inpatient Procedure: 2D Echo, Cardiac Doppler and Color Doppler Indications:    Atrial fibrillation  History:         Patient has no prior history of Echocardiogram examinations.                 COPD; Risk Factors:Dyslipidemia and Current Smoker. PAD.  Sonographer:    Ross Ludwig RDCS (AE) Referring Phys: [id number] VASUNDHRA RATHORE IMPRESSIONS  1. Left ventricular ejection fraction, by estimation, is 60 to 65%. The left ventricle has normal function. The left ventricle has no regional wall motion abnormalities. There is moderate left ventricular hypertrophy. Left ventricular diastolic parameters are indeterminate.  2. Right ventricular systolic function is normal. The right ventricular size is normal. There is normal pulmonary artery systolic pressure.  3. The mitral valve is normal in structure. Trivial mitral valve regurgitation. No evidence of mitral stenosis.  4. The aortic valve is normal in structure. Aortic valve regurgitation is not visualized. No aortic stenosis is present.  5. The inferior vena cava is normal in size with <50% respiratory variability, suggesting right atrial pressure of 8 mmHg. FINDINGS  Left Ventricle: Left ventricular ejection fraction, by estimation, is 60 to 65%. The left ventricle has normal function. The left ventricle has no regional wall motion abnormalities. The left ventricular internal cavity size was normal in size. There is  moderate left ventricular hypertrophy. Left ventricular diastolic parameters are indeterminate. Indeterminate filling pressures. Right Ventricle: The right ventricular size is normal. No increase in right ventricular wall thickness. Right ventricular systolic function is normal. There is normal pulmonary artery systolic pressure. The tricuspid regurgitant velocity is 2.64 m/s, and  with an assumed right atrial pressure of 8  mmHg, the estimated right ventricular systolic pressure is 35.9 mmHg. Left Atrium: Left atrial size was normal in size. Right Atrium: Right atrial size was normal in size. Pericardium: There is no evidence of pericardial effusion. Mitral Valve: The  mitral valve is normal in structure. There is moderate thickening of the mitral valve leaflet(s). Normal mobility of the mitral valve leaflets. Moderate mitral annular calcification. Trivial mitral valve regurgitation. No evidence of mitral valve stenosis. Tricuspid Valve: The tricuspid valve is normal in structure. Tricuspid valve regurgitation is mild . No evidence of tricuspid stenosis. Aortic Valve: The aortic valve is normal in structure.. There is severe thickening and severe calcifcation of the aortic valve. Aortic valve regurgitation is not visualized. No aortic stenosis is present. There is severe thickening of the aortic valve. There is severe calcifcation of the aortic valve. Aortic valve mean gradient measures 4.0 mmHg. Aortic valve peak gradient measures 6.6 mmHg. Aortic valve area, by VTI measures 1.28 cm. Pulmonic Valve: The pulmonic valve was normal in structure. Pulmonic valve regurgitation is not visualized. No evidence of pulmonic stenosis. Aorta: The aortic root is normal in size and structure. Venous: The inferior vena cava is normal in size with less than 50% respiratory variability, suggesting right atrial pressure of 8 mmHg. IAS/Shunts: The interatrial septum appears to be lipomatous. No atrial level shunt detected by color flow Doppler.  LEFT VENTRICLE PLAX 2D LVIDd:         3.70 cm LVIDs:         2.20 cm LV PW:         1.10 cm LV IVS:        1.40 cm LVOT diam:     1.70 cm LV SV:         33 LV SV Index:   21 LVOT Area:     2.27 cm  RIGHT VENTRICLE            IVC RV Basal diam:  2.90 cm    IVC diam: 1.90 cm RV S prime:     7.83 cm/s TAPSE (M-mode): 1.7 cm LEFT ATRIUM             Index       RIGHT ATRIUM           Index LA diam:        3.20 cm 2.09 cm/m  RA Area:     15.20 cm LA Vol (A2C):   29.5 ml 19.23 ml/m RA Volume:   35.50 ml  23.14 ml/m LA Vol (A4C):   39.7 ml 25.87 ml/m LA Biplane Vol: 37.9 ml 24.70 ml/m  AORTIC VALVE AV Area (Vmax):    1.28 cm AV Area (Vmean):   1.23 cm AV  Area (VTI):     1.28 cm AV Vmax:           128.00 cm/s AV Vmean:          92.500 cm/s AV VTI:            0.255 m AV Peak Grad:      6.6 mmHg AV Mean Grad:      4.0 mmHg LVOT Vmax:         72.40 cm/s LVOT Vmean:        50.200 cm/s LVOT VTI:          0.144 m LVOT/AV VTI ratio: 0.56  AORTA Ao Root diam: 3.40 cm Ao Asc diam:  3.10 cm TRICUSPID VALVE TR Peak grad:   27.9 mmHg  TR Vmax:        264.00 cm/s  SHUNTS Systemic VTI:  0.14 m Systemic Diam: 1.70 cm Chilton Si MD Electronically signed by Chilton Si MD Signature Date/Time: 08/22/2020/1:01:16 PM    Final    US Abdomen Limited RUQ  Result Date: 08/22/2020 CLINICAL DATA:  Nausea and diarrhea for the past 5 days. EXAM: ULTRASOUND ABDOMEN LIMITED RIGHT UPPER QUADRANT COMPARISON:  CT abdomen pelvis from same day. FINDINGS: Gallbladder: Small gallstones and sludge. No wall thickening visualized. No sonographic Murphy sign noted by sonographer. Common bile duct: Diameter: 4 mm, normal. Liver: No focal lesion identified. Within normal limits in parenchymal echogenicity. Portal vein is patent on color Doppler imaging with normal direction of blood flow towards the liver. Other: None. IMPRESSION: 1. No acute abnormality. 2. Chronic cholelithiasis and sludge. Electronically Signed   By: Obie Dredge M.D.   On: 08/22/2020 14:53     Cardiac Studies   ECHO:  08/22/2020 1. Left ventricular ejection fraction, by estimation, is 60 to 65%. The  left ventricle has normal function. The left ventricle has no regional  wall motion abnormalities. There is moderate left ventricular hypertrophy.  Left ventricular diastolic  parameters are indeterminate.  2. Right ventricular systolic function is normal. The right ventricular  size is normal. There is normal pulmonary artery systolic pressure.  3. The mitral valve is normal in structure. Trivial mitral valve  regurgitation. No evidence of mitral stenosis.  4. The aortic valve is normal in structure.  Aortic valve regurgitation is  not visualized. No aortic stenosis is present.  5. The inferior vena cava is normal in size with <50% respiratory  variability, suggesting right atrial pressure of 8 mmHg.   Patient Profile     74 y.o. female w/ hx tobacco use, HTN, peripheral vascular disease, HLD, COPD was admitted 08/24 w/ N&V, bloody diarrhea, AKI, SIRS, low K+/Mg+, Atrial fib.   Assessment & Plan    1. New dx atrial fibrillation:  - bloody diarrhea reason for no anticoag so far - tolerating Lopressor 12.5 mg bid - HR is controlled - plan TEE/DCCV once can anticoagulate - alternative plan would be to start anticoagulation and DCCV in 3 weeks as oupt if she tolerates bloodthinner  2. N&V, bloody diarrhea - GI following, GI panel pending - no C diff - on Zosyn and probiotics  3. AKI:  - peak Cr 4.16, now 1.06 w/ no S&S volume overload - per IM  4. Electrolytes - K+ 2.6 on 08/25 s/p Kdur 120 meq and improved - Mg 1.3 on admit s/p supplement and improved  5. Anemia - macrocytic - Hgb stable in the 10s - +FOB  Otherwise, per IM Principal Problem:   Bloody diarrhea Active Problems:   Nausea and vomiting   SIRS (systemic inflammatory response syndrome) (HCC)   Acute blood loss anemia   AKI (acute kidney injury) (HCC)    Signed, Theodore Demark , PA-C 7:49 AM 08/24/2020 Pager: [phone]

## 2020-08-25 LAB — COMPREHENSIVE METABOLIC PANEL
ALT: 89 U/L — ABNORMAL HIGH (ref 0–44)
AST: 90 U/L — ABNORMAL HIGH (ref 15–41)
Albumin: 2.6 g/dL — ABNORMAL LOW (ref 3.5–5.0)
Alkaline Phosphatase: 59 U/L (ref 38–126)
Anion gap: 8 (ref 5–15)
BUN: 10 mg/dL (ref 8–23)
CO2: 21 mmol/L — ABNORMAL LOW (ref 22–32)
Calcium: 8.1 mg/dL — ABNORMAL LOW (ref 8.9–10.3)
Chloride: 101 mmol/L (ref 98–111)
Creatinine, Ser: 0.86 mg/dL (ref 0.44–1.00)
GFR calc Af Amer: >60 mL/min (ref >60)
GFR calc non Af Amer: >60 mL/min (ref >60)
Glucose, Bld: 106 mg/dL — ABNORMAL HIGH (ref 70–99)
Potassium: 3.5 mmol/L (ref 3.5–5.1)
Sodium: 130 mmol/L — ABNORMAL LOW (ref 135–145)
Total Bilirubin: 0.2 mg/dL — ABNORMAL LOW (ref 0.3–1.2)
Total Protein: 5.4 g/dL — ABNORMAL LOW (ref 6.5–8.1)

## 2020-08-25 MED ORDER — METOPROLOL TARTRATE 25 MG PO TABS: 12.5000 mg | ORAL_TABLET | Freq: Two times a day (BID) | ORAL | 2 refills | Status: AC

## 2020-08-25 MED ORDER — LOPERAMIDE HCL 2 MG PO CAPS: 2.0000 mg | ORAL_CAPSULE | ORAL | 0 refills | Status: AC | PRN

## 2020-08-25 MED ORDER — APIXABAN 5 MG PO TABS
5.0000 mg | ORAL_TABLET | Freq: Two times a day (BID) | ORAL | Status: DC
  Administered 2020-08-25: 5 mg via ORAL
  Filled 2020-08-25: qty 1

## 2020-08-25 MED ORDER — POTASSIUM CHLORIDE CRYS ER 20 MEQ PO TBCR
40.0000 meq | EXTENDED_RELEASE_TABLET | Freq: Once | ORAL | Status: AC
  Administered 2020-08-25: 40 meq via ORAL
  Filled 2020-08-25: qty 2

## 2020-08-25 MED ORDER — LOPERAMIDE HCL 2 MG PO CAPS
2.0000 mg | ORAL_CAPSULE | ORAL | Status: DC | PRN
  Filled 2020-08-25: qty 1

## 2020-08-25 MED ORDER — APIXABAN 5 MG PO TABS: 5.0000 mg | ORAL_TABLET | Freq: Two times a day (BID) | ORAL | 2 refills | Status: DC

## 2020-08-25 MED ORDER — SACCHAROMYCES BOULARDII 250 MG PO CAPS: 250.0000 mg | ORAL_CAPSULE | Freq: Two times a day (BID) | ORAL | 2 refills | Status: AC

## 2020-08-25 MED FILL — ELIQUIS 5 MG TABLET: 5 | 30 days supply | Qty: 60 | Fill #0

## 2020-08-25 NOTE — Discharge Summary (Signed)
Physician Discharge Summary  Erin Calderon ZMO:[id number] DOB: March 05, 1946 DOA: 08/21/2020  PCP: Aretta Nip, MD  Admit date: 08/21/2020 Discharge date: 08/25/2020  Admitted From: Home Disposition: Home Recommendations for Outpatient Follow-up:  1. Follow up with PCP in 1-2 weeks 2. Please obtain BMP/CBC in one week 3. Please follow up with GI and cardiology:  Home Health: None Equipment/Devices none  discharge Condition: Stable CODE STATUS: Full code Diet recommendation: Cardiac diet Brief/Interim Summary:Erin Smithis a 74 y.o.femalewith medical history significant ofhypertension, hyperlipidemia, COPD, PVD, tobacco use presenting with complaints ofnausea, vomiting, and bloody diarrhea. Patient reports having nausea, nonbloody emesis, and diarrhea for the past 10 days. States for the past 5 days the diarrhea has become bloody. She had some abdominal pain previously and does not remember where it was located in her abdomen. Denies lightheadedness/dizziness, chest pain, palpitations, or shortness of breath. She takes aspirin 81 mg daily at home and no other blood thinners. She has been vaccinated against Covid.  ED Course:Afebrile. Found to be in A. fib with rate in the 90-110 range. WBC count 16.4. Hemoglobin 10.7, was 13.1 on labs done in January 2019. No recent labs for comparison. FOBT positive. Platelet count normal. No significant elevation of lipase. LFTs elevated (AST 169, ALT 96, T bili 1.5). Alk phos normal. Sodium 129, potassium 3.7, chloride 90, bicarb 20, BUN 46, creatinine 4.1, and glucose 86. Creatinine was 0.8 on labs done in November 2020. Corrected calcium 8.1. INR 0.9. SARS-CoV-2 PCR test negative.  Chest x-ray showing changes consistent with underlying COPD and no acute cardiopulmonary process.   Discharge Diagnoses:  Principal Problem:   Bloody diarrhea Active Problems:   Nausea and vomiting   SIRS (systemic inflammatory response  syndrome) (HCC)   Acute blood loss anemia   AKI (acute kidney injury) ([place])     #1 bloody diarrhea with nausea and vomiting-patient admitted with nausea vomiting and diarrhea. Patient was reported to have bloody diarrhea with FOBT positive. Work-up included C. difficile panel and GI which was negative. Patient was initially started on Zosyn presumably for colitis as she had a white count of 16 on admission.  CT scan showed no evidence of bowel inflammation.  She was seen in consultation by GI. Patient will follow up with GI after discharge for further work-up.  This was discussed with patient and her daughter. At the time of admission it was thought that patient had SIRS as she had leukocytosis with mild tachypnea and tachycardia.  She was afebrile.  This has been resolved.  So far there is no evidence of sepsis. Blood cultures negative so far. Continue probiotics.  #2 new onset A. fib with RVR-rate controlled on Lopressor.  Continue beta-blocker.  Eliquis started 5 mg twice daily.  Follow-up with cardiology.  #3 AKI resolved with IVF.Creatinine 0.86 on dc   #4 hyponatremia/hypokalemia  k was 3.5 and sodium 130 on dc  #5 history of essential hypertension blood pressure 107/58 on Lopressor.  Continue Lopressor 12.5 twice a day.  Norvasc Cozaar and bisoprolol were stopped during this hospital stay.  HCTZ was also stopped.   #6 history of COPD stable continue nebulizer  #7 cirrhosis with history of alcohol abuse    Estimated body mass index is 18.3 kg/m as calculated from the following:   Height as of this encounter: _0  (1.651 m).   Weight as of this encounter: 49.9 kg.  Discharge Instructions  Discharge Instructions    Diet - low sodium heart healthy   Complete  by: As directed    Increase activity slowly   Complete by: As directed      Allergies as of 08/25/2020      Reactions   Hydrocodone Nausea And Vomiting   Latex Itching, Rash   Reaction to latex gloves and  rubber goods      Medication List    STOP taking these medications   amLODipine 5 MG tablet Commonly known as: NORVASC   aspirin EC 81 MG tablet   bisoprolol 10 MG tablet Commonly known as: ZEBETA   hydrochlorothiazide 12.5 MG capsule Commonly known as: MICROZIDE   losartan 100 MG tablet Commonly known as: COZAAR   potassium chloride 10 MEQ tablet Commonly known as: KLOR-CON     TAKE these medications   albuterol 108 (90 Base) MCG/ACT inhaler Commonly known as: VENTOLIN HFA Inhale 2 puffs into the lungs every 6 (six) hours as needed for wheezing or shortness of breath.   albuterol (2.5 MG/3ML) 0.083% nebulizer solution Commonly known as: PROVENTIL Take 2.5 mg by nebulization every 6 (six) hours as needed for wheezing or shortness of breath.   apixaban 5 MG Tabs tablet Commonly known as: ELIQUIS Take 1 tablet (5 mg total) by mouth 2 (two) times daily.   fluticasone 50 MCG/ACT nasal spray Commonly known as: FLONASE Place 1-2 sprays into both nostrils daily as needed for allergies or rhinitis.   folic acid 209 MCG tablet Commonly known as: FOLVITE Take 400 mcg by mouth daily.   loperamide 2 MG capsule Commonly known as: IMODIUM Take 1 capsule (2 mg total) by mouth as needed for diarrhea or loose stools (one capsule after each loose stool (max 4 capsules/24h)).   metoprolol tartrate 25 MG tablet Commonly known as: LOPRESSOR Take 0.5 tablets (12.5 mg total) by mouth 2 (two) times daily. What changed:   medication strength  how much to take  how to take this  when to take this  additional instructions   multivitamin with minerals Tabs tablet Take 1 tablet by mouth daily.   pantoprazole 40 MG tablet Commonly known as: PROTONIX Take 1 tablet (40 mg total) by mouth daily.   rosuvastatin 20 MG tablet Commonly known as: CRESTOR Take 1 tablet (20 mg total) by mouth daily.   saccharomyces boulardii 250 MG capsule Commonly known as: FLORASTOR Take 1  capsule (250 mg total) by mouth 2 (two) times daily.   traMADol 50 MG tablet Commonly known as: ULTRAM Take 50 mg by mouth every 6 (six) hours as needed for moderate pain.   Vitamin B-12 500 MCG Subl Place 1,000 mcg under the tongue daily.   vitamin E 200 UNIT capsule Take 200 Units by mouth daily.       Follow-up Information    Deberah Pelton, NP Follow up on 09/15/2020.   Specialty: Cardiology Why: @ 9:45 Contact information: [place] [address] [phone]        Rankins, Victoria R, MD Follow up.   Specialty: Family Medicine Contact information: [place]  [address] [phone]        Buford Dresser, MD .   Specialty: Cardiology Contact information: [address] [place] [address] [phone]        Arta Silence, MD Follow up.   Specialty: Gastroenterology Contact information: [address] [place] [address] [phone]              Allergies  Allergen Reactions  . Hydrocodone  Nausea And Vomiting  . Latex Itching and Rash    Reaction to latex gloves and rubber goods    Consultations: GI and cardiology Procedures/Studies: CT ABDOMEN PELVIS WO CONTRAST  Result Date: 08/22/2020 CLINICAL DATA:  Loss of appetite, nausea, diarrhea for 5 days. History of abdominal pain. Bloody stools for 2 days. Suspected infectious gastroenteritis or colitis. EXAM: CT ABDOMEN AND PELVIS WITHOUT CONTRAST TECHNIQUE: Multidetector CT imaging of the abdomen and pelvis was performed following the standard protocol without IV contrast. COMPARISON:  CT abdomen dated 12/03/2019. FINDINGS: Lower chest: Mild bibasilar atelectasis/scarring. Hepatobiliary: Small layering stones and or sludge. No pericholecystic inflammation. Liver contours are slightly nodular suggesting underlying cirrhosis. Two punctate stones within the distal common bile duct. Two additional stones at the  upper margin of the common bile duct, possibly within the the cystic duct, largest measuring 3 mm. Pancreas: Unremarkable. No pancreatic ductal dilatation or surrounding inflammatory changes. Spleen: Normal in size without focal abnormality. Adrenals/Urinary Tract: Adrenal glands appear normal. Kidneys are unremarkable without mass, stone or hydronephrosis. No ureteral or bladder calculi are identified. Bladder appears normal. Stomach/Bowel: No dilated large or small bowel loops. No convincing evidence of bowel wall inflammation. Stomach is unremarkable, partially decompressed limiting characterization of its walls. Appendix is not seen but there are no focal inflammatory changes about the cecum to suggest acute appendicitis. Vascular/Lymphatic: Advanced aortic atherosclerosis. No enlarged lymph nodes are seen. Reproductive: Presumed hysterectomy.  No adnexal mass. Other: No free fluid or abscess collection is seen. No free intraperitoneal air. Musculoskeletal: Chronic compression fracture deformities at the thoracolumbar junction, with associated degenerative change, stable. No acute appearing osseous abnormality. IMPRESSION: 1. Choledocholithiasis, with 2 punctate stones located within the distal common bile duct, and an additional 3 mm stone within the upper CBD versus cystic duct. No evidence of acute cholecystitis but would consider confirmation with RIGHT upper quadrant ultrasound and/or nuclear medicine HIDA scan. 2. Cholelithiasis. 3. No bowel obstruction or evidence of bowel wall inflammation. No renal or ureteral calculi. 4. Cirrhotic-appearing liver. Aortic Atherosclerosis (ICD10-I70.0). Electronically Signed   By: Franki Cabot M.D.   On: 08/22/2020 08:35   US RENAL  Result Date: 08/22/2020 CLINICAL DATA:  Initial evaluation for acute kidney injury. EXAM: RENAL / URINARY TRACT ULTRASOUND COMPLETE COMPARISON:  Prior CT from earlier the same day. FINDINGS: Right Kidney: Renal measurements: 10.8 x 3.3 x  4.3 cm = volume: 80.2 mL. Renal echogenicity within normal limits. No nephrolithiasis or hydronephrosis. No focal renal mass. Left Kidney: Renal measurements: 11.2 x 5.1 x 4.6 cm = volume: 137.6 mL. Evaluation left kidney somewhat limited by positioning and body habitus. Visualized renal parenchyma grossly within normal limits. No nephrolithiasis or hydronephrosis. No focal renal mass. Bladder: Appears normal for degree of bladder distention. Other: None. IMPRESSION: Normal renal ultrasound. No hydronephrosis or other significant finding. Electronically Signed   By: Jeannine Boga M.D.   On: 08/22/2020 04:17   DG Chest Portable 1 View  Result Date: 08/21/2020 CLINICAL DATA:  Weakness, anorexia EXAM: PORTABLE CHEST 1 VIEW COMPARISON:  12/03/2016 FINDINGS: The lungs are mildly hyperinflated suggesting changes of underlying COPD. The lungs are clear. No pneumothorax or pleural effusion. Cardiac size is within normal limits. Pulmonary vascularity normal. No acute bone abnormality. IMPRESSION: No acute cardiopulmonary process.  COPD. Electronically Signed   By: Fidela Salisbury MD   On: 08/21/2020 21:21   ECHOCARDIOGRAM COMPLETE  Result Date: 08/22/2020    ECHOCARDIOGRAM REPORT   Patient Name:   Erin Calderon Date of  Exam: 08/22/2020 Medical Rec #:  [id number]     Height:       65.0 in Accession #:    [id number]    Weight:       110.0 lb Date of Birth:  1946/02/07     BSA:          1.534 m Patient Age:    37 years      BP:           110/61 mmHg Patient Gender: F             HR:           86 bpm. Exam Location:  Inpatient Procedure: 2D Echo, Cardiac Doppler and Color Doppler Indications:    Atrial fibrillation  History:        Patient has no prior history of Echocardiogram examinations.                 COPD; Risk Factors:Dyslipidemia and Current Smoker. PAD.  Sonographer:    Clayton Lefort RDCS (AE) Referring Phys: [id number] Helen  1. Left ventricular ejection fraction, by estimation, is 60 to  65%. The left ventricle has normal function. The left ventricle has no regional wall motion abnormalities. There is moderate left ventricular hypertrophy. Left ventricular diastolic parameters are indeterminate.  2. Right ventricular systolic function is normal. The right ventricular size is normal. There is normal pulmonary artery systolic pressure.  3. The mitral valve is normal in structure. Trivial mitral valve regurgitation. No evidence of mitral stenosis.  4. The aortic valve is normal in structure. Aortic valve regurgitation is not visualized. No aortic stenosis is present.  5. The inferior vena cava is normal in size with <50% respiratory variability, suggesting right atrial pressure of 8 mmHg. FINDINGS  Left Ventricle: Left ventricular ejection fraction, by estimation, is 60 to 65%. The left ventricle has normal function. The left ventricle has no regional wall motion abnormalities. The left ventricular internal cavity size was normal in size. There is  moderate left ventricular hypertrophy. Left ventricular diastolic parameters are indeterminate. Indeterminate filling pressures. Right Ventricle: The right ventricular size is normal. No increase in right ventricular wall thickness. Right ventricular systolic function is normal. There is normal pulmonary artery systolic pressure. The tricuspid regurgitant velocity is 2.64 m/s, and  with an assumed right atrial pressure of 8 mmHg, the estimated right ventricular systolic pressure is 48.8 mmHg. Left Atrium: Left atrial size was normal in size. Right Atrium: Right atrial size was normal in size. Pericardium: There is no evidence of pericardial effusion. Mitral Valve: The mitral valve is normal in structure. There is moderate thickening of the mitral valve leaflet(s). Normal mobility of the mitral valve leaflets. Moderate mitral annular calcification. Trivial mitral valve regurgitation. No evidence of mitral valve stenosis. Tricuspid Valve: The tricuspid valve  is normal in structure. Tricuspid valve regurgitation is mild . No evidence of tricuspid stenosis. Aortic Valve: The aortic valve is normal in structure.. There is severe thickening and severe calcifcation of the aortic valve. Aortic valve regurgitation is not visualized. No aortic stenosis is present. There is severe thickening of the aortic valve. There is severe calcifcation of the aortic valve. Aortic valve mean gradient measures 4.0 mmHg. Aortic valve peak gradient measures 6.6 mmHg. Aortic valve area, by VTI measures 1.28 cm. Pulmonic Valve: The pulmonic valve was normal in structure. Pulmonic valve regurgitation is not visualized. No evidence of pulmonic stenosis. Aorta: The aortic root is normal in size and structure.  Venous: The inferior vena cava is normal in size with less than 50% respiratory variability, suggesting right atrial pressure of 8 mmHg. IAS/Shunts: The interatrial septum appears to be lipomatous. No atrial level shunt detected by color flow Doppler.  LEFT VENTRICLE PLAX 2D LVIDd:         3.70 cm LVIDs:         2.20 cm LV PW:         1.10 cm LV IVS:        1.40 cm LVOT diam:     1.70 cm LV SV:         33 LV SV Index:   21 LVOT Area:     2.27 cm  RIGHT VENTRICLE            IVC RV Basal diam:  2.90 cm    IVC diam: 1.90 cm RV S prime:     7.83 cm/s TAPSE (M-mode): 1.7 cm LEFT ATRIUM             Index       RIGHT ATRIUM           Index LA diam:        3.20 cm 2.09 cm/m  RA Area:     15.20 cm LA Vol (A2C):   29.5 ml 19.23 ml/m RA Volume:   35.50 ml  23.14 ml/m LA Vol (A4C):   39.7 ml 25.87 ml/m LA Biplane Vol: 37.9 ml 24.70 ml/m  AORTIC VALVE AV Area (Vmax):    1.28 cm AV Area (Vmean):   1.23 cm AV Area (VTI):     1.28 cm AV Vmax:           128.00 cm/s AV Vmean:          92.500 cm/s AV VTI:            0.255 m AV Peak Grad:      6.6 mmHg AV Mean Grad:      4.0 mmHg LVOT Vmax:         72.40 cm/s LVOT Vmean:        50.200 cm/s LVOT VTI:          0.144 m LVOT/AV VTI ratio: 0.56  AORTA Ao  Root diam: 3.40 cm Ao Asc diam:  3.10 cm TRICUSPID VALVE TR Peak grad:   27.9 mmHg TR Vmax:        264.00 cm/s  SHUNTS Systemic VTI:  0.14 m Systemic Diam: 1.70 cm Skeet Latch MD Electronically signed by Skeet Latch MD Signature Date/Time: 08/22/2020/1:01:16 PM    Final    US Abdomen Limited RUQ  Result Date: 08/22/2020 CLINICAL DATA:  Nausea and diarrhea for the past 5 days. EXAM: ULTRASOUND ABDOMEN LIMITED RIGHT UPPER QUADRANT COMPARISON:  CT abdomen pelvis from same day. FINDINGS: Gallbladder: Small gallstones and sludge. No wall thickening visualized. No sonographic Murphy sign noted by sonographer. Common bile duct: Diameter: 4 mm, normal. Liver: No focal lesion identified. Within normal limits in parenchymal echogenicity. Portal vein is patent on color Doppler imaging with normal direction of blood flow towards the liver. Other: None. IMPRESSION: 1. No acute abnormality. 2. Chronic cholelithiasis and sludge. Electronically Signed   By: Titus Dubin M.D.   On: 08/22/2020 14:53    (Echo, Carotid, EGD, Colonoscopy, ERCP)    Subjective:  Patient resting in bed no further diarrhea no nausea vomiting tolerating p.o. intake no abdominal pain Discharge Exam: Vitals:   08/24/20 1939 08/25/20 0439  BP: 110/71 Marland Kitchen)  114/93  Pulse: 96 95  Resp: 20 16  Temp: 98.5 F (36.9 C) 98 F (36.7 C)  SpO2: 96% 98%   Vitals:   08/24/20 0513 08/24/20 1345 08/24/20 1939 08/25/20 0439  BP: 114/64 109/69 110/71 (!) 114/93  Pulse: 76 95 96 95  Resp: _0 Temp: 97.7 F (36.5 C) 98.7 F (37.1 C) 98.5 F (36.9 C) 98 F (36.7 C)  TempSrc: Oral Oral Oral Oral  SpO2: 96% 95% 96% 98%  Weight:      Height:        General: Pt is alert, awake, not in acute distress Cardiovascular: RRR, S1/S2 +, no rubs, no gallops Respiratory: CTA bilaterally, no wheezing, no rhonchi Abdominal: Soft, NT, ND, bowel sounds + Extremities: no edema, no cyanosis    The results of significant diagnostics  from this hospitalization (including imaging, microbiology, ancillary and laboratory) are listed below for reference.     Microbiology: Recent Results (from the past 240 hour(s))  SARS Coronavirus 2 by RT PCR (hospital order, performed in [facility] hospital lab) Nasopharyngeal Nasopharyngeal Swab     Status: None   Collection Time: 08/21/20 11:08 PM   Specimen: Nasopharyngeal Swab  Result Value Ref Range Status   SARS Coronavirus 2 NEGATIVE NEGATIVE Final    Comment: (NOTE) SARS-CoV-2 target nucleic acids are NOT DETECTED.  The SARS-CoV-2 RNA is generally detectable in upper and lower respiratory specimens during the acute phase of infection. The lowest concentration of SARS-CoV-2 viral copies this assay can detect is 250 copies / mL. A negative result does not preclude SARS-CoV-2 infection and should not be used as the sole basis for treatment or other patient management decisions.  A negative result may occur with improper specimen collection / handling, submission of specimen other than nasopharyngeal swab, presence of viral mutation(s) within the areas targeted by this assay, and inadequate number of viral copies (<250 copies / mL). A negative result must be combined with clinical observations, patient history, and epidemiological information.  Fact Sheet for Patients:   StrictlyIdeas.no  Fact Sheet for Healthcare Providers: BankingDealers.co.za  This test is not yet approved or  cleared by the [place] FDA and has been authorized for detection and/or diagnosis of SARS-CoV-2 by FDA under an Emergency Use Authorization (EUA).  This EUA will remain in effect (meaning this test can be used) for the duration of the COVID-19 declaration under Section 564(b)(1) of the Act, 21 U.S.C. section 360bbb-3(b)(1), unless the authorization is terminated or revoked sooner.  Performed at [facility], [place] [address]  [place], [place] [address]   Culture, blood (routine x 2)     Status: None (Preliminary result)   Collection Time: 08/22/20  4:29 AM   Specimen: BLOOD  Result Value Ref Range Status   Specimen Description BLOOD SITE NOT SPECIFIED  Final   Special Requests   Final    BOTTLES DRAWN AEROBIC AND ANAEROBIC Blood Culture results may not be optimal due to an inadequate volume of blood received in culture bottles   Culture   Final    NO GROWTH 3 DAYS Performed at [facility], [place] [address] [place], [place] [address]    Report Status PENDING  Incomplete  Culture, blood (routine x 2)     Status: None (Preliminary result)   Collection Time: 08/22/20  4:34 AM   Specimen: BLOOD  Result Value Ref Range Status   Specimen Description BLOOD SITE NOT SPECIFIED  Final  Special Requests   Final    BOTTLES DRAWN AEROBIC AND ANAEROBIC Blood Culture results may not be optimal due to an inadequate volume of blood received in culture bottles   Culture   Final    NO GROWTH 3 DAYS Performed at [facility], [place] [address] [place], [place] [address]    Report Status PENDING  Incomplete  C Difficile Quick Screen w PCR reflex     Status: None   Collection Time: 08/23/20 12:10 PM   Specimen: STOOL  Result Value Ref Range Status   C Diff antigen NEGATIVE NEGATIVE Final   C Diff toxin NEGATIVE NEGATIVE Final   C Diff interpretation No C. difficile detected.  Final    Comment: Performed at [facility], [place] [address] [place], [place] [address]  Gastrointestinal Panel by PCR , Stool     Status: None   Collection Time: 08/23/20 12:10 PM   Specimen: Stool  Result Value Ref Range Status   Campylobacter species NOT DETECTED NOT DETECTED Final   Plesimonas shigelloides NOT DETECTED NOT DETECTED Final   Salmonella species NOT DETECTED NOT DETECTED Final   Yersinia enterocolitica NOT DETECTED NOT DETECTED Final   Vibrio species NOT DETECTED NOT DETECTED Final   Vibrio cholerae NOT DETECTED NOT  DETECTED Final   Enteroaggregative E coli (EAEC) NOT DETECTED NOT DETECTED Final   Enteropathogenic E coli (EPEC) NOT DETECTED NOT DETECTED Final   Enterotoxigenic E coli (ETEC) NOT DETECTED NOT DETECTED Final   Shiga like toxin producing E coli (STEC) NOT DETECTED NOT DETECTED Final   Shigella/Enteroinvasive E coli (EIEC) NOT DETECTED NOT DETECTED Final   Cryptosporidium NOT DETECTED NOT DETECTED Final   Cyclospora cayetanensis NOT DETECTED NOT DETECTED Final   Entamoeba histolytica NOT DETECTED NOT DETECTED Final   Giardia lamblia NOT DETECTED NOT DETECTED Final   Adenovirus F40/41 NOT DETECTED NOT DETECTED Final   Astrovirus NOT DETECTED NOT DETECTED Final   Norovirus GI/GII NOT DETECTED NOT DETECTED Final   Rotavirus A NOT DETECTED NOT DETECTED Final   Sapovirus (I, II, IV, and V) NOT DETECTED NOT DETECTED Final    Comment: Performed at [facility], [place] [place], [place] [address]     Labs: BNP (last 3 results) No results for input(s): BNP in the last 8760 hours. Basic Metabolic Panel: Recent Labs  Lab 08/22/20 0314 08/22/20 0314 08/22/20 1750 08/23/20 0350 08/23/20 1900 08/24/20 1415 08/25/20 0026  NA 130*   < > 129* 130* 129* 133* 130*  K 2.5*   < > 3.0* 2.6* 3.7 3.5 3.5  CL 94*   < > 97* 97* 97* 98 101  CO2 20*   < > 18* 15* 22 24 21*  GLUCOSE 79   < > 96 69* 142* 142* 106*  BUN 40*   < > 33* 29* _0 CREATININE 3.27*   < > 1.89* 1.46* 1.06* 0.94 0.86  CALCIUM 7.4*   < > 7.6* 7.7* 8.2* 8.5* 8.1*  MG 1.3*  --  2.4 2.3  --   --   --    < > = values in this interval not displayed.   Liver Function Tests: Recent Labs  Lab 08/21/20 2305 08/22/20 0314 08/23/20 0350 08/24/20 1415 08/25/20 0026  AST 169* 121* 111* 118* 90*  ALT 96* 86* 82* 109* 89*  ALKPHOS 78 69 60 74 59  BILITOT 1.5* 0.6 0.6 0.3 0.2*  PROT 5.9* 5.6* 5.1* 6.1* 5.4*  ALBUMIN 3.1* 2.7* 2.5* 3.0* 2.6*   Recent Labs  Lab 08/21/20 2305  LIPASE 56*   No results for  input(s): AMMONIA in the last 168 hours. CBC: Recent Labs  Lab 08/21/20 2305 08/22/20 0314 08/23/20 0350 08/24/20 1415  WBC 16.4* 15.1* 13.3* 7.4  NEUTROABS 13.2*  --   --   --   HGB 10.7* 10.0* 10.3* 10.6*  HCT 30.9* 29.2* 29.9* 31.3*  MCV 101.6* 103.2* 102.7* 102.6*  PLT 273 296 272 318   Cardiac Enzymes: No results for input(s): CKTOTAL, CKMB, CKMBINDEX, TROPONINI in the last 168 hours. BNP: Invalid input(s): POCBNP CBG: No results for input(s): GLUCAP in the last 168 hours. D-Dimer No results for input(s): DDIMER in the last 72 hours. Hgb A1c No results for input(s): HGBA1C in the last 72 hours. Lipid Profile No results for input(s): CHOL, HDL, LDLCALC, TRIG, CHOLHDL, LDLDIRECT in the last 72 hours. Thyroid function studies Recent Labs    08/22/20 1750  TSH 0.923   Anemia work up No results for input(s): VITAMINB12, FOLATE, FERRITIN, TIBC, IRON, RETICCTPCT in the last 72 hours. Urinalysis No results found for: COLORURINE, APPEARANCEUR, [place], [place], [place], [place], [place], [place], PROTEINUR, UROBILINOGEN, NITRITE, LEUKOCYTESUR Sepsis Labs Invalid input(s): PROCALCITONIN,  WBC,  LACTICIDVEN Microbiology Recent Results (from the past 240 hour(s))  SARS Coronavirus 2 by RT PCR (hospital order, performed in [facility] hospital lab) Nasopharyngeal Nasopharyngeal Swab     Status: None   Collection Time: 08/21/20 11:08 PM   Specimen: Nasopharyngeal Swab  Result Value Ref Range Status   SARS Coronavirus 2 NEGATIVE NEGATIVE Final    Comment: (NOTE) SARS-CoV-2 target nucleic acids are NOT DETECTED.  The SARS-CoV-2 RNA is generally detectable in upper and lower respiratory specimens during the acute phase of infection. The lowest concentration of SARS-CoV-2 viral copies this assay can detect is 250 copies / mL. A negative result does not preclude SARS-CoV-2 infection and should not be used as the sole basis for treatment or other patient management  decisions.  A negative result may occur with improper specimen collection / handling, submission of specimen other than nasopharyngeal swab, presence of viral mutation(s) within the areas targeted by this assay, and inadequate number of viral copies (<250 copies / mL). A negative result must be combined with clinical observations, patient history, and epidemiological information.  Fact Sheet for Patients:   StrictlyIdeas.no  Fact Sheet for Healthcare Providers: BankingDealers.co.za  This test is not yet approved or  cleared by the [place] FDA and has been authorized for detection and/or diagnosis of SARS-CoV-2 by FDA under an Emergency Use Authorization (EUA).  This EUA will remain in effect (meaning this test can be used) for the duration of the COVID-19 declaration under Section 564(b)(1) of the Act, 21 U.S.C. section 360bbb-3(b)(1), unless the authorization is terminated or revoked sooner.  Performed at [facility], [place] [address] [place], [place] [address]   Culture, blood (routine x 2)     Status: None (Preliminary result)   Collection Time: 08/22/20  4:29 AM   Specimen: BLOOD  Result Value Ref Range Status   Specimen Description BLOOD SITE NOT SPECIFIED  Final   Special Requests   Final    BOTTLES DRAWN AEROBIC AND ANAEROBIC Blood Culture results may not be optimal due to an inadequate volume of blood received in culture bottles   Culture   Final    NO GROWTH 3 DAYS Performed at [facility], [place],  [place] [address]    Report Status PENDING  Incomplete  Culture, blood (routine x 2)     Status: None (Preliminary result)   Collection Time: 08/22/20  4:34 AM   Specimen: BLOOD  Result Value Ref Range Status   Specimen Description BLOOD SITE NOT SPECIFIED  Final   Special Requests   Final    BOTTLES DRAWN AEROBIC AND ANAEROBIC Blood Culture results may not be optimal due to an inadequate  volume of blood received in culture bottles   Culture   Final    NO GROWTH 3 DAYS Performed at [facility], [place] [address] [place], [place] [address]    Report Status PENDING  Incomplete  C Difficile Quick Screen w PCR reflex     Status: None   Collection Time: 08/23/20 12:10 PM   Specimen: STOOL  Result Value Ref Range Status   C Diff antigen NEGATIVE NEGATIVE Final   C Diff toxin NEGATIVE NEGATIVE Final   C Diff interpretation No C. difficile detected.  Final    Comment: Performed at [facility], [place] [address], [place], [place] [address]  Gastrointestinal Panel by PCR , Stool     Status: None   Collection Time: 08/23/20 12:10 PM   Specimen: Stool  Result Value Ref Range Status   Campylobacter species NOT DETECTED NOT DETECTED Final   Plesimonas shigelloides NOT DETECTED NOT DETECTED Final   Salmonella species NOT DETECTED NOT DETECTED Final   Yersinia enterocolitica NOT DETECTED NOT DETECTED Final   Vibrio species NOT DETECTED NOT DETECTED Final   Vibrio cholerae NOT DETECTED NOT DETECTED Final   Enteroaggregative E coli (EAEC) NOT DETECTED NOT DETECTED Final   Enteropathogenic E coli (EPEC) NOT DETECTED NOT DETECTED Final   Enterotoxigenic E coli (ETEC) NOT DETECTED NOT DETECTED Final   Shiga like toxin producing E coli (STEC) NOT DETECTED NOT DETECTED Final   Shigella/Enteroinvasive E coli (EIEC) NOT DETECTED NOT DETECTED Final   Cryptosporidium NOT DETECTED NOT DETECTED Final   Cyclospora cayetanensis NOT DETECTED NOT DETECTED Final   Entamoeba histolytica NOT DETECTED NOT DETECTED Final   Giardia lamblia NOT DETECTED NOT DETECTED Final   Adenovirus F40/41 NOT DETECTED NOT DETECTED Final   Astrovirus NOT DETECTED NOT DETECTED Final   Norovirus GI/GII NOT DETECTED NOT DETECTED Final   Rotavirus A NOT DETECTED NOT DETECTED Final   Sapovirus (I, II, IV, and V) NOT DETECTED NOT DETECTED Final    Comment: Performed at [facility], [address],  [place], [place] [address]     Time coordinating discharge:  39 minutes  SIGNED:   Georgette Shell, MD  [facility] 08/25/2020, 1:44 PM

## 2020-08-25 NOTE — Discharge Instructions (Signed)

## 2020-08-25 NOTE — Progress Notes (Signed)
Progress Note  Patient Name: Erin Calderon Date of Encounter: 08/25/2020  [facility] Cardiologist: Jodelle Red, MD   Subjective   Patient still feeling weak, she is wanting to go home. Breathing feels short. No chest pain.   Inpatient Medications    Scheduled Meds: . metoprolol tartrate  12.5 mg Oral BID  . saccharomyces boulardii  250 mg Oral BID   Continuous Infusions: . piperacillin-tazobactam (ZOSYN)  IV 12.5 mL/hr at 08/25/20 0730   PRN Meds: ipratropium-albuterol, loperamide   Vital Signs    Vitals:   08/24/20 0513 08/24/20 1345 08/24/20 1939 08/25/20 0439  BP: 114/64 109/69 110/71 (!) 114/93  Pulse: 76 95 96 95  Resp: 15 20 20 16   Temp: 97.7 F (36.5 C) 98.7 F (37.1 C) 98.5 F (36.9 C) 98 F (36.7 C)  TempSrc: Oral Oral Oral Oral  SpO2: 96% 95% 96% 98%  Weight:      Height:        Intake/Output Summary (Last 24 hours) at 08/25/2020 0843 Last data filed at 08/25/2020 0730 Gross per 24 hour  Intake 1028.45 ml  Output --  Net 1028.45 ml   Last 3 Weights 08/21/2020 01/03/2020 11/24/2019  Weight (lbs) 110 lb 117 lb 116 lb 6.4 oz  Weight (kg) 49.896 kg 53.071 kg 52.799 kg      Telemetry    N/A - Personally Reviewed  ECG    pending - Personally Reviewed  Physical Exam   GEN: No acute distress.   Neck: No JVD Cardiac: Irreg Irreg, no murmurs, rubs, or gallops.  Respiratory: Clear to auscultation bilaterally. GI: Soft, nontender, non-distended  MS: No edema; No deformity. Neuro:  Nonfocal  Psych: Normal affect   Labs    High Sensitivity Troponin:  No results for input(s): TROPONINIHS in the last 720 hours.    Chemistry Recent Labs  Lab 08/23/20 0350 08/23/20 0350 08/23/20 1900 08/24/20 1415 08/25/20 0026  NA 130*   < > 129* 133* 130*  K 2.6*   < > 3.7 3.5 3.5  CL 97*   < > 97* 98 101  CO2 15*   < > 22 24 21*  GLUCOSE 69*   < > 142* 142* 106*  BUN 29*   < > 18 10 10   CREATININE 1.46*   < > 1.06* 0.94 0.86  CALCIUM  7.7*   < > 8.2* 8.5* 8.1*  PROT 5.1*  --   --  6.1* 5.4*  ALBUMIN 2.5*  --   --  3.0* 2.6*  AST 111*  --   --  118* 90*  ALT 82*  --   --  109* 89*  ALKPHOS 60  --   --  74 59  BILITOT 0.6  --   --  0.3 0.2*  GFRNONAA 35*   < > 51* 59* >60  GFRAA 40*   < > 59* >60 >60  ANIONGAP 18*   < > 10 11 8    < > = values in this interval not displayed.     Hematology Recent Labs  Lab 08/22/20 0314 08/23/20 0350 08/24/20 1415  WBC 15.1* 13.3* 7.4  RBC 2.83*  2.97* 2.91* 3.05*  HGB 10.0* 10.3* 10.6*  HCT 29.2* 29.9* 31.3*  MCV 103.2* 102.7* 102.6*  MCH 35.3* 35.4* 34.8*  MCHC 34.2 34.4 33.9  RDW 14.5 14.5 14.7  PLT 296 272 318    BNPNo results for input(s): BNP, PROBNP in the last 168 hours.   DDimer No results for  input(s): DDIMER in the last 168 hours.   Radiology    No results found.  Cardiac Studies   Echo 08/22/20 1. Left ventricular ejection fraction, by estimation, is 60 to 65%. The  left ventricle has normal function. The left ventricle has no regional  wall motion abnormalities. There is moderate left ventricular hypertrophy.  Left ventricular diastolic  parameters are indeterminate.  2. Right ventricular systolic function is normal. The right ventricular  size is normal. There is normal pulmonary artery systolic pressure.  3. The mitral valve is normal in structure. Trivial mitral valve  regurgitation. No evidence of mitral stenosis.  4. The aortic valve is normal in structure. Aortic valve regurgitation is  not visualized. No aortic stenosis is present.  5. The inferior vena cava is normal in size with <50% respiratory  variability, suggesting right atrial pressure of 8 mmHg.   Patient Profile     74 y.o. female  w/ hx tobacco use, HTN, peripheral vascular disease, HLD, COPD was admitted 08/24 w/ N&V, bloody diarrhea, AKI, SIRS, low K+/Mg+, Atrial fib.    Assessment & Plan    New onset Afib - in the setting of bloody diarrhea so not started on a/c -  Lopressor 12.5mg  BID - HR controlled in the 90s - Patient might require TEE/DCCV,  which can likely be outpatient since patient is essentially asymptomatic  GI upset - C diff and GI panel negative - abx per primary/GI team  AKI - peak creatinine 4.16 - improved with hydration, today 0.86  Electrolyte abnormality - K+ 3.5 continue to supplement - Mg 2.3 on 8/25  Anemia - macrocytic - Hgb stable in the 10s - +FOB - Possible flex sig per GI   For questions or updates, please contact [facility] Please consult [email or web address] for contact info under        Signed, Unity Luepke David Stall, PA-C  08/25/2020, 8:43 AM

## 2020-08-25 NOTE — Progress Notes (Signed)
Patient discharged to home with instructions given to patient and her daughter. 

## 2020-08-25 NOTE — Progress Notes (Signed)
ANTICOAGULATION CONSULT NOTE - Initial Consult  Pharmacy Consult for Eliquis Indication: atrial fibrillation  Allergies  Allergen Reactions  . Hydrocodone Nausea And Vomiting  . Latex Itching and Rash    Reaction to latex gloves and rubber goods    Patient Measurements: Height: 5\' 5"  (165.1 cm) Weight: 49.9 kg (110 lb) IBW/kg (Calculated) : 57  Vital Signs: Temp: 98 F (36.7 C) (08/27 0439) Temp Source: Oral (08/27 0439) BP: 114/93 (08/27 0439) Pulse Rate: 95 (08/27 0439)  Labs: Recent Labs    08/23/20 0350 08/23/20 0350 08/23/20 1900 08/24/20 1415 08/25/20 0026  HGB 10.3*  --   --  10.6*  --   HCT 29.9*  --   --  31.3*  --   PLT 272  --   --  318  --   CREATININE 1.46*   < > 1.06* 0.94 0.86   < > = values in this interval not displayed.    Estimated Creatinine Clearance: 44.5 mL/min (by C-G formula based on SCr of 0.86 mg/dL).   Medical History: Past Medical History:  Diagnosis Date  . Hypertension    Assessment:  74 yr old female to begin Eliquis for new atrial fibrillation.  Bloody diarrhea resolved, Hgb stable.  For discharge today. Discussed briefly with Dr. 61.  Goal of Therapy:  appropriate Eliquis dose for indication Monitor platelets by anticoagulation protocol: Yes   Plan:   Eliquis 5 mg BID  To stay off PTA Aspirin.  Discussed anticoagulation precautions with patient.  Jerolyn Center, RPh Phone: [phone] 08/25/2020,12:37 PM

## 2020-08-25 NOTE — Care Management (Signed)
Entered benefit check for Eliquis 5 mg PO BID x 30 days.   Changed pharmacy to [facility] they will use 30 day free card and bring medication to patient's room prior to discharge.  Ronny Flurry RN

## 2020-08-25 NOTE — TOC Benefit Eligibility Note (Signed)
Transition of Care [facility]) Benefit Eligibility Note    Patient Details  Name: Erin Calderon MRN: [id number] Date of Birth: April 15, 1946   Medication/Dose: Everlene Balls   5 MG BID  Covered?: Yes  Tier: 3 Drug  Prescription Coverage [facility]erred [facility]  Spoke with Person/Company/Phone Number:: [employer]ERN[employer] @ [employer] # [phone]  Co-Pay: $47.00  Prior Approval: No  Deductible: Unmet       Mardene Sayer Phone Number: 08/25/2020, 2:37 PM

## 2020-08-25 NOTE — Progress Notes (Signed)
[facility] Gastroenterology Progress Note  Erin Calderon 74 y.o. Jun 26, 1946  CC:  Diarrhea  Subjective: Diarrhea is improving per patient, even though she has not yet had any Imodium.  Patient denies any abdominal pain, nausea, vomiting.  Is tolerating a regular diet.  States she is interested in discharge.  Per flowsheet, 2 liquid green stools yesterday.  No signs of melena or hematochezia.  ROS : Review of Systems  Cardiovascular: Negative for chest pain and palpitations.  Gastrointestinal: Positive for diarrhea. Negative for abdominal pain, blood in stool, constipation, heartburn, melena, nausea and vomiting.    Objective: Vital signs in last 24 hours: Vitals:   08/24/20 1939 08/25/20 0439  BP: 110/71 (!) 114/93  Pulse: 96 95  Resp: 20 16  Temp: 98.5 F (36.9 C) 98 F (36.7 C)  SpO2: 96% 98%    Physical Exam:  General:  Thin, elderly, cooperative, sitting in bed eating breakfast, no distress  Head:  Normocephalic, without obvious abnormality, atraumatic  Eyes:   Anicteric sclera, EOMs intact  Lungs:   Breathing comfortably on room air  Heart:  Regular rate with irregular rhythm (A fib)  Abdomen:   Soft, non-tender, non-distended, no guarding or peritoneal signs  Extremities: Extremities normal, atraumatic, no  edema  Pulses: 2+ and symmetric    Lab Results: Recent Labs    08/22/20 1750 08/22/20 1750 08/23/20 0350 08/23/20 1900 08/24/20 1415 08/25/20 0026  NA 129*   < > 130*   < > 133* 130*  K 3.0*   < > 2.6*   < > 3.5 3.5  CL 97*   < > 97*   < > 98 101  CO2 18*   < > 15*   < > 24 21*  GLUCOSE 96   < > 69*   < > 142* 106*  BUN 33*   < > 29*   < > 10 10  CREATININE 1.89*   < > 1.46*   < > 0.94 0.86  CALCIUM 7.6*   < > 7.7*   < > 8.5* 8.1*  MG 2.4  --  2.3  --   --   --    < > = values in this interval not displayed.   Recent Labs    08/24/20 1415 08/25/20 0026  AST 118* 90*  ALT 109* 89*  ALKPHOS 74 59  BILITOT 0.3 0.2*  PROT 6.1* 5.4*  ALBUMIN 3.0*  2.6*   Recent Labs    08/23/20 0350 08/24/20 1415  WBC 13.3* 7.4  HGB 10.3* 10.6*  HCT 29.9* 31.3*  MCV 102.7* 102.6*  PLT 272 318   No results for input(s): LABPROT, INR in the last 72 hours.  Impression: Rectal bleeding, diarrhea: -Hemoglobin 10.6 yesterday, stable  -C. Diff, GI pathogen panel not yet collected  Cirrhosis, MELD 26 as of 08/21/20 -T bili 0.2/AST 90/ALT 89/ALP 59 today  Severe hypokalemia, resolved: Potassium 3.5 today  AKI, resolved: BUN 10/Cr 0.86 today today  Possible choledocholithiasis per noncontrast CT, though patient asymptomatic  New-onset A fib, rate controlled, cardiology following  Plan: Imodium one capsule (2g) after each liquid stool, max 4 capsules/day.  We will arrange outpatient follow-up for MRCP with contrast, as well as possible EGD/colonoscopy.  No current indications for inpatient procedures.  Discussed with the patient the importance of complete alcohol cessation in the setting of cirrhosis (patient drinking 1 to 2 glasses of wine per day).  Discussed worsening liver function, as well as complications from alcohol use.  Patient  verbalized understanding.  Okay to discharge from a GI standpoint.  [facility] will sign off.  Please contact us if we can be of any further assistance during his hospital stay.  Edrick Kins PA-C 08/25/2020, 10:18 AM  Contact #  [phone]

## 2020-08-27 LAB — CULTURE, BLOOD (ROUTINE X 2)
Culture: NO GROWTH
Culture: NO GROWTH

## 2020-09-14 NOTE — Progress Notes (Deleted)
Cardiology Clinic Note   Patient Name: Erin Calderon Date of Encounter: 09/14/2020  Primary Care Provider:  Clayborn Heron, Calderon Primary Cardiologist:  Erin Calderon  Patient Profile    Erin Calderon 74 year old female presents the clinic today for a follow-up evaluation of her atrial fibrillation, and essential hypertension.  Past Medical History    Past Medical History:  Diagnosis Date  . Hypertension    No past surgical history on file.  Allergies  Allergies  Allergen Reactions  . Hydrocodone Nausea And Vomiting  . Latex Itching and Rash    Reaction to latex gloves and rubber goods    History of Present Illness    Erin Calderon has a PMH of essential hypertension, HLD, COPD, PVD, tobacco use, nausea, vomiting, bloody diarrhea.  Recently admitted 8/23/ 21-8/27/2021 with complaints of nausea nonbloody emesis, and diarrhea for 10 days.  She indicated that over the 5 days prior to her admission her diarrhea became bloody.  She indicated that she also had some previous abdominal pain.  She denied lightheadedness, dizziness, chest pain, palpitations and shortness of breath.  She was taking 81 mg aspirin but denied blood thinning medications.  She received COVID-19 vaccination.  She was found to be in atrial fibrillation with a rate between 90 and 110.  White count was 16.4, hemoglobin 10.7, platelet count normal.  CXR consistent with underlying COPD no acute cardiopulmonary process.  She was placed on metoprolol was started on Eliquis for her new onset atrial fibrillation with RVR.  Her acute kidney injury resolved and her creatinine on discharge was 0.86.  She was noted to have hyponatremia/hypokalemia with a K of 3.5 and sodium of 130 on discharge.  Blood pressure on discharge was noted to be 107/58.  She was continued on metoprolol 12.5 daily.  Her amlodipine, losartan, bisoprolol and HCTZ were stopped during her admission.  She presents to the clinic today  for follow-up evaluation and states***  *** denies chest pain, shortness of breath, lower extremity edema, fatigue, palpitations, melena, hematuria, hemoptysis, diaphoresis, weakness, presyncope, syncope, orthopnea, and PND.   Home Medications    Prior to Admission medications   Medication Sig Start Date End Date Taking? Authorizing Provider  albuterol (PROVENTIL HFA;VENTOLIN HFA) 108 (90 Base) MCG/ACT inhaler Inhale 2 puffs into the lungs every 6 (six) hours as needed for wheezing or shortness of breath.    Provider, Historical, Calderon  albuterol (PROVENTIL) (2.5 MG/3ML) 0.083% nebulizer solution Take 2.5 mg by nebulization every 6 (six) hours as needed for wheezing or shortness of breath.    Provider, Historical, Calderon  apixaban (ELIQUIS) 5 MG TABS tablet Take 1 tablet (5 mg total) by mouth 2 (two) times daily. 08/25/20   Erin Ren, Calderon  Cyanocobalamin (VITAMIN B-12) 500 MCG SUBL Place 1,000 mcg under the tongue daily.    Provider, Historical, Calderon  fluticasone (FLONASE) 50 MCG/ACT nasal spray Place 1-2 sprays into both nostrils daily as needed for allergies or rhinitis.    Provider, Historical, Calderon  folic acid (FOLVITE) 400 MCG tablet Take 400 mcg by mouth daily.    Provider, Historical, Calderon  loperamide (IMODIUM) 2 MG capsule Take 1 capsule (2 mg total) by mouth as needed for diarrhea or loose stools (one capsule after each loose stool (max 4 capsules/24h)). 08/25/20   Erin Ren, Calderon  metoprolol tartrate (LOPRESSOR) 25 MG tablet Take 0.5 tablets (12.5 mg total) by mouth 2 (two) times daily. 08/25/20   Erin Ren,  Calderon  Multiple Vitamin (MULTIVITAMIN WITH MINERALS) TABS tablet Take 1 tablet by mouth daily.    Provider, Historical, Calderon  pantoprazole (PROTONIX) 40 MG tablet Take 1 tablet (40 mg total) by mouth daily. 12/03/16   Nyoka Cowden, Calderon  rosuvastatin (CRESTOR) 20 MG tablet Take 1 tablet (20 mg total) by mouth daily. 01/03/20 08/22/29  Erin Calderon  saccharomyces  boulardii (FLORASTOR) 250 MG capsule Take 1 capsule (250 mg total) by mouth 2 (two) times daily. 08/25/20   Erin Ren, Calderon  traMADol (ULTRAM) 50 MG tablet Take 50 mg by mouth every 6 (six) hours as needed for moderate pain.    Provider, Historical, Calderon  vitamin E 200 UNIT capsule Take 200 Units by mouth daily.    Provider, Historical, Calderon    Family History    No family history on file. has no family status information on file.   Social History    Social History   Socioeconomic History  . Marital status: Widowed    Spouse name: Not on file  . Number of children: Not on file  . Years of education: Not on file  . Highest education level: Not on file  Occupational History  . Not on file  Tobacco Use  . Smoking status: Current Every Day Smoker    Packs/day: 0.50    Types: Cigarettes  . Smokeless tobacco: Never Used  Substance and Sexual Activity  . Alcohol use: Yes  . Drug use: No  . Sexual activity: Not on file  Other Topics Concern  . Not on file  Social History Narrative  . Not on file   Social Determinants of Health   Financial Resource Strain:   . Difficulty of Paying Living Expenses: Not on file  Food Insecurity:   . Worried About Programme researcher, broadcasting/film/video in the Last Year: Not on file  . Ran Out of Food in the Last Year: Not on file  Transportation Needs:   . Lack of Transportation (Medical): Not on file  . Lack of Transportation (Non-Medical): Not on file  Physical Activity:   . Days of Exercise per Week: Not on file  . Minutes of Exercise per Session: Not on file  Stress:   . Feeling of Stress : Not on file  Social Connections:   . Frequency of Communication with Friends and Family: Not on file  . Frequency of Social Gatherings with Friends and Family: Not on file  . Attends Religious Services: Not on file  . Active Member of Clubs or Organizations: Not on file  . Attends Banker Meetings: Not on file  . Marital Status: Not on file  Intimate  Partner Violence:   . Fear of Current or Ex-Partner: Not on file  . Emotionally Abused: Not on file  . Physically Abused: Not on file  . Sexually Abused: Not on file     Review of Systems    General:  No chills, fever, night sweats or weight changes.  Cardiovascular:  No chest pain, dyspnea on exertion, edema, orthopnea, palpitations, paroxysmal nocturnal dyspnea. Dermatological: No rash, lesions/masses Respiratory: No cough, dyspnea Urologic: No hematuria, dysuria Abdominal:   No nausea, vomiting, diarrhea, bright Calderon blood per rectum, melena, or hematemesis Neurologic:  No visual changes, wkns, changes in mental status. All other systems reviewed and are otherwise negative except as noted above.  Physical Exam    VS:  There were no vitals taken for this visit. , BMI There is no height  or weight on file to calculate BMI. GEN: Well nourished, well developed, in no acute distress. HEENT: normal. Neck: Supple, no JVD, carotid bruits, or masses. Cardiac: RRR, no murmurs, rubs, or gallops. No clubbing, cyanosis, edema.  Radials/DP/PT 2+ and equal bilaterally.  Respiratory:  Respirations regular and unlabored, clear to auscultation bilaterally. GI: Soft, nontender, nondistended, BS + x 4. MS: no deformity or atrophy. Skin: warm and dry, no rash. Neuro:  Strength and sensation are intact. Psych: Normal affect.  Accessory Clinical Findings    Recent Labs: 08/22/2020: TSH 0.923 08/23/2020: Magnesium 2.3 08/24/2020: Hemoglobin 10.6; Platelets 318 08/25/2020: ALT 89; BUN 10; Creatinine, Ser 0.86; Potassium 3.5; Sodium 130   Recent Lipid Panel No results found for: CHOL, TRIG, HDL, CHOLHDL, VLDL, LDLCALC, LDLDIRECT  ECG personally reviewed by me today- *** - No acute changes  Echocardiogram 08/22/2020  IMPRESSIONS    1. Left ventricular ejection fraction, by estimation, is 60 to 65%. The  left ventricle has normal function. The left ventricle has no regional  wall motion  abnormalities. There is moderate left ventricular hypertrophy.  Left ventricular diastolic  parameters are indeterminate.  2. Right ventricular systolic function is normal. The right ventricular  size is normal. There is normal pulmonary artery systolic pressure.  3. The mitral valve is normal in structure. Trivial mitral valve  regurgitation. No evidence of mitral stenosis.  4. The aortic valve is normal in structure. Aortic valve regurgitation is  not visualized. No aortic stenosis is present.  5. The inferior vena cava is normal in size with <50% respiratory  variability, suggesting right atrial pressure of 8 mmHg.  Assessment & Plan   1.  Atrial fibrillation with RVR-EKG today shows***.  No increased dyspnea or activity intolerance today.  Recently admitted to the emergency department and found to be in A. fib RVR rates varying between 90-1 10.  She was started on Lopressor and Eliquis at that time.  Discussed option of DCCV.  She expressed understanding and  wishes to defer. Continue metoprolol, Eliquis Heart healthy low-sodium diet-salty 6 given Increase physical activity as tolerated  Essential hypertension-BP today***.  Well-controlled at home. Continue metoprolol Heart healthy low-sodium diet-salty 6 given Increase physical activity as tolerated  Acute kidney injury-creatinine initially elevated during admission but resolved with IV fluids.  Creatinine at discharge 0.86 Followed by PCP  Hyponatremia/hypokalemia-potassium 3.5 and sodium 130 on discharge.  No further episodes of emesis or diarrhea.  Appetite has returned and she is eating well. Follow-up by PCP  Disposition: Follow-up with Dr. Cristal Deer in 3 months.  Thomasene Ripple. Melquisedec Journey NP-C    09/14/2020, 8:03 AM [facility] [address] Office [phone] Fax [phone]  Notice: This dictation was prepared with Dragon dictation along with smaller phrase technology. Any  transcriptional errors that result from this process are unintentional and may not be corrected upon review.

## 2020-09-15 ENCOUNTER — Ambulatory Visit: Payer: Medicare Other | Admitting: General Practice

## 2020-09-22 ENCOUNTER — Other Ambulatory Visit: Payer: Self-pay | Admitting: Gastroenterology

## 2020-09-22 DIAGNOSIS — I10 Essential (primary) hypertension: Secondary | ICD-10-CM | POA: Diagnosis not present

## 2020-09-22 DIAGNOSIS — K839 Disease of biliary tract, unspecified: Secondary | ICD-10-CM | POA: Diagnosis not present

## 2020-09-22 DIAGNOSIS — K219 Gastro-esophageal reflux disease without esophagitis: Secondary | ICD-10-CM | POA: Diagnosis not present

## 2020-09-22 DIAGNOSIS — J449 Chronic obstructive pulmonary disease, unspecified: Secondary | ICD-10-CM | POA: Diagnosis not present

## 2020-09-22 DIAGNOSIS — M810 Age-related osteoporosis without current pathological fracture: Secondary | ICD-10-CM | POA: Diagnosis not present

## 2020-09-22 DIAGNOSIS — K625 Hemorrhage of anus and rectum: Secondary | ICD-10-CM | POA: Diagnosis not present

## 2020-09-22 DIAGNOSIS — E7849 Other hyperlipidemia: Secondary | ICD-10-CM | POA: Diagnosis not present

## 2020-09-26 ENCOUNTER — Other Ambulatory Visit: Payer: Self-pay

## 2020-09-26 ENCOUNTER — Ambulatory Visit (INDEPENDENT_AMBULATORY_CARE_PROVIDER_SITE_OTHER): Payer: Medicare Other | Admitting: Cardiology

## 2020-09-26 ENCOUNTER — Encounter: Payer: Self-pay | Admitting: Cardiology

## 2020-09-26 VITALS — BP 180/99 | HR 118 | Temp 97.6°F | Wt 126.8 lb

## 2020-09-26 DIAGNOSIS — I4819 Other persistent atrial fibrillation: Secondary | ICD-10-CM | POA: Diagnosis not present

## 2020-09-26 DIAGNOSIS — F17210 Nicotine dependence, cigarettes, uncomplicated: Secondary | ICD-10-CM | POA: Diagnosis not present

## 2020-09-26 DIAGNOSIS — I10 Essential (primary) hypertension: Secondary | ICD-10-CM

## 2020-09-26 DIAGNOSIS — M7989 Other specified soft tissue disorders: Secondary | ICD-10-CM

## 2020-09-26 DIAGNOSIS — I739 Peripheral vascular disease, unspecified: Secondary | ICD-10-CM

## 2020-09-26 DIAGNOSIS — Z716 Tobacco abuse counseling: Secondary | ICD-10-CM

## 2020-09-26 MED ORDER — AMLODIPINE BESYLATE 5 MG PO TABS: 5.0000 mg | ORAL_TABLET | Freq: Every day | ORAL | 3 refills | Status: AC

## 2020-09-26 MED ORDER — SPIRONOLACTONE 25 MG PO TABS: 25.0000 mg | ORAL_TABLET | Freq: Every day | ORAL | 3 refills | Status: AC

## 2020-09-26 NOTE — Progress Notes (Signed)
Cardiology Office Note:    Date:  09/26/2020   ID:  Erin Calderon, Reames 10-Jun-1946, MRN [id number]  PCP:  Clayborn Heron, MD  Cardiologist:  Jodelle Red, MD (prior Dr. Donnie Aho)  Referring MD: Clayborn Heron, MD   CC: follow up  History of Present Illness:    Erin Calderon is a 74 y.o. female with a hx of tobacco use, hypertension, peripheral vascular disease, hyperlipidemia, COPD who is seen in follow up today. I initially saw her as a new patient to me on 11/16/19. She was last seen by Dr. Donnie Aho on 06/18/2018. She is also followed by Dr. Allyson Sabal for PAD.  Summary of notes from Dr. York Spaniel reports: Echo 03/16/2018 EF 65%, moderate cLVH, grade 1 diastolic dysfunction. Mild-moderate TR, mild-moderate PR. No documented stress/cath  Today: Here with daughter today. Patient endorses that she does not want to be here today.  Recently admitted for dehydration/GI bug. Blood pressure was very low during that admission. Since discharge, blood pressure has been elevated. Stopped amlodipine, bisoprolol, HCTZ, losartan. Started on metoprolol, apixaban for afib that admission. Currently on only metoprolol.   Breathing is rough, not using inhaler. Still smoking, trying to cut back. Discussed importance of cessation.  Both blood pressure and heart rate elevated today. Legs have been swollen. Weight is up since discharge based on our scale, doesn't weight routinely at home.   Denies chest pain, breathing is at baseline. No PND, orthopnea. No syncope or palpitations.  Past Medical History:  Diagnosis Date   Hypertension     No past surgical history on file.  Current Medications: Current Outpatient Medications on File Prior to Visit  Medication Sig   albuterol (PROVENTIL HFA;VENTOLIN HFA) 108 (90 Base) MCG/ACT inhaler Inhale 2 puffs into the lungs every 6 (six) hours as needed for wheezing or shortness of breath.   albuterol (PROVENTIL) (2.5 MG/3ML) 0.083%  nebulizer solution Take 2.5 mg by nebulization every 6 (six) hours as needed for wheezing or shortness of breath.   apixaban (ELIQUIS) 5 MG TABS tablet Take 1 tablet (5 mg total) by mouth 2 (two) times daily.   Cyanocobalamin (VITAMIN B-12) 500 MCG SUBL Place 1,000 mcg under the tongue daily.   fluticasone (FLONASE) 50 MCG/ACT nasal spray Place 1-2 sprays into both nostrils daily as needed for allergies or rhinitis.   folic acid (FOLVITE) 400 MCG tablet Take 400 mcg by mouth daily.   loperamide (IMODIUM) 2 MG capsule Take 1 capsule (2 mg total) by mouth as needed for diarrhea or loose stools (one capsule after each loose stool (max 4 capsules/24h)).   metoprolol tartrate (LOPRESSOR) 25 MG tablet Take 0.5 tablets (12.5 mg total) by mouth 2 (two) times daily.   Multiple Vitamin (MULTIVITAMIN WITH MINERALS) TABS tablet Take 1 tablet by mouth daily.   pantoprazole (PROTONIX) 40 MG tablet Take 1 tablet (40 mg total) by mouth daily.   rosuvastatin (CRESTOR) 20 MG tablet Take 1 tablet (20 mg total) by mouth daily.   saccharomyces boulardii (FLORASTOR) 250 MG capsule Take 1 capsule (250 mg total) by mouth 2 (two) times daily.   traMADol (ULTRAM) 50 MG tablet Take 50 mg by mouth every 6 (six) hours as needed for moderate pain.   vitamin E 200 UNIT capsule Take 200 Units by mouth daily.   No current facility-administered medications on file prior to visit.     Allergies:   Hydrocodone and Latex   Social History   Tobacco Use   Smoking status: Current  Every Day Smoker    Packs/day: 0.50    Types: Cigarettes   Smokeless tobacco: Never Used  Substance Use Topics   Alcohol use: Yes   Drug use: No    Family History: Father deceased, natural causes. Mother deceased, dementia. Sister deceased, CVA/dementia. Sister deceased, cancer.  ROS:   Please see the history of present illness.  Additional pertinent ROS otherwise unremarkable.  EKGs/Labs/Other Studies Reviewed:    The  following studies were reviewed today:  Echo 08/22/20 1. Left ventricular ejection fraction, by estimation, is 60 to 65%. The  left ventricle has normal function. The left ventricle has no regional  wall motion abnormalities. There is moderate left ventricular hypertrophy.  Left ventricular diastolic  parameters are indeterminate.  2. Right ventricular systolic function is normal. The right ventricular  size is normal. There is normal pulmonary artery systolic pressure.  3. The mitral valve is normal in structure. Trivial mitral valve  regurgitation. No evidence of mitral stenosis.  4. The aortic valve is normal in structure. Aortic valve regurgitation is  not visualized. No aortic stenosis is present.  5. The inferior vena cava is normal in size with <50% respiratory  variability, suggesting right atrial pressure of 8 mmHg.  CT angio with runoff 12/04/19 FINDINGS: VASCULAR  Coronary calcifications.  Aorta: Heavily calcified atheromatous plaque. Mild tortuosity. No aneurysm, dissection, or stenosis.  Celiac: Calcified ostial plaque resulting in short segment stenosis of possible hemodynamic significance, patent distally.  SMA: Calcified ostial plaque resulting in short segment stenosis of possible hemodynamic significance, patent distally. Classic distal branch anatomy.  Renals: Single bilaterally, both with heavily calcified ostial plaque resulting in short segment stenosis of possible hemodynamic significance.  IMA: Limited evaluation on this venous phase examination  RIGHT Lower Extremity  Inflow: Heavily calcified plaque throughout the iliac arterial system resulting in tandem regions of at least mild stenosis.  Outflow: Deep femoral branches atheromatous but patent. Diffuse calcified plaque through the SFA and popliteal artery which remain patent.  Runoff: Heavy calcifications in the visualized proximal tibial runoff, not evaluated distally.  LEFT  Lower Extremity  Inflow: Heavily calcified plaque through the iliac arterial system.  Outflow: Calcified plaque through the SFA and popliteal artery which remain patent.  Runoff: Proximal anterior tibial artery and tibioperoneal trunk patent. Not evaluated distally  VEINS  Normal appearance of bilateral common femoral veins, iliac venous system, IVC.  Renal veins patent bilaterally.  Diminutive but patent splenic vein. Patent SMV, portal venous system, and hepatic veins.  No venous pathology is identified.  Review of the MIP images confirms the above findings.  NON-VASCULAR  Lower chest: No pleural or pericardial effusion.  Hepatobiliary: Chronic atrophy in the medial segment left hepatic lobe. No focal liver lesion or biliary ductal dilatation. Subcentimeter partially calcified layering calculi in the dependent aspect of the nondilated gallbladder.  Pancreas: Unremarkable. No pancreatic ductal dilatation or surrounding inflammatory changes.  Spleen: Diminutive, without focal lesion.  Adrenals/Urinary Tract: Normal adrenal glands. No hydronephrosis or evident urolithiasis. Urinary bladder incompletely distended.  Stomach/Bowel: Stomach is nondistended. Small bowel nondilated. Colon is nondistended, unremarkable.  Lymphatic: No abdominal or pelvic adenopathy.  Reproductive: Status post hysterectomy. No adnexal masses.  Other: No ascites. Bilateral pelvic phleboliths. No free air.  Musculoskeletal: Transitional lumbosacral segment assigned S1. 5 non rib-bearing lumbar type vertebral bodies. Chronic compression deformity T12.  L1 compression fracture with some gas between fracture fragments, approximately 30% loss of height anteriorly, no significant retropulsion or posterior element involvement.  L2 compression  fracture with some gas in the fracture, approximately 20% loss of height anteriorly, no significant retropulsion or posterior  element involvement. Spondylitic changes T11-L4.  IMPRESSION: 1. No evidence of iliocaval thrombus, compressive pathology, or venous occlusion. 2. L1 and L2 vertebral compression fractures, possibly subacute. Chronic T12 compression deformity. 3. Bilateral lower extremity inflow and outflow arterial occlusive disease of indeterminate hemodynamic significance. Correlate with clinical symptomatology and ABIs. 4. Renal and visceral arterial origin stenoses, of possible hemodynamic significance, not well evaluated on this venous phase study. 5. Cholelithiasis  EKG:  EKG is personally reviewed.  The ekg ordered 08/25/20 demonstrates atrial fibrillation at 92 bpm  Recent Labs: 08/22/2020: TSH 0.923 08/23/2020: Magnesium 2.3 08/24/2020: Hemoglobin 10.6; Platelets 318 08/25/2020: ALT 89; BUN 10; Creatinine, Ser 0.86; Potassium 3.5; Sodium 130  Recent Lipid Panel No results found for: CHOL, TRIG, HDL, CHOLHDL, VLDL, LDLCALC, LDLDIRECT  Physical Exam:    VS:  BP (!) 180/99    Pulse (!) 118    Temp 97.6 F (36.4 C)    Wt 126 lb 12.8 oz (57.5 kg)    SpO2 95%    BMI 21.10 kg/m     Wt Readings from Last 3 Encounters:  09/26/20 126 lb 12.8 oz (57.5 kg)  08/21/20 110 lb (49.9 kg)  01/03/20 117 lb (53.1 kg)    GEN: Well nourished, well developed in no acute distress HEENT: Normal, moist mucous membranes NECK: JVD at clavicle CARDIAC: irregularly irregular rhythm, normal S1 and S2, no rubs or gallops. 1/6 systolic murmur. VASCULAR: Radial and DP pulses 2+ bilaterally. No carotid bruits RESPIRATORY:  Distant breath sounds with prolonged expiratory phase ABDOMEN: Soft, non-tender, non-distended MUSCULOSKELETAL:  Ambulates independently SKIN: Warm and dry, bilateral LE 1+ edema. NEUROLOGIC:  Alert and oriented x 3. No focal neuro deficits noted. PSYCHIATRIC:  Normal affect   ASSESSMENT:    1. Hypertension, unspecified type   2. Calf swelling   3. Tobacco abuse counseling   4. Persistent  atrial fibrillation (HCC)   5. Peripheral arterial disease (HCC)    PLAN:    Atrial fibrillation, persistent vs paroxysmal -tolerating anticoagulation without issues -heart rate lower on exam -discussed attempt at rate control. Would like to optimize her volume status first -CHA2DS2/VAS Stroke Risk Points= 4  -continue apixaban  Hypertension: elevated today -many of her medications discontinued during hospitalization -will restart amlodipine (despite leg swelling, will watch closely)--do not want multiple agents that can affect kidneys given recent dehydration/AKI -starting spironolactone as her Na, K have run low -continue metoprolol for now, may consider change to carvedilol  LE edema, likely diastolic heart failure exacerbated by afib -starting spironolactone as above -counseled on salt, fluid,compression, elevation -if not improving, may need to add loop diuretic -monitor closely, will call if worsens  Tobacco use, with counseling: The patient was counseled on tobacco cessation today for 3 minutes.  Counseling included reviewing the risks of smoking tobacco products, how it impacts the patient's current medical diagnoses and different strategies for quitting.  Pharmacotherapy to aid in tobacco cessation was not prescribed today.  Arterial calcification, diffuse, with PAD -counseled extensively on cholesterol/plaque formation and what calcium notes -encouraged tobacco cessation, above -continue rosuvastatin 20 mg -aspirin stopped with start of apixaban  Cardiac risk counseling and prevention recommendations: -recommend heart healthy/Mediterranean diet, with whole grains, fruits, vegetable, fish, lean meats, nuts, and olive oil. Limit salt. -recommend moderate walking, 3-5 times/week for 30-50 minutes each session. Aim for at least 150 minutes.week. Goal should be pace of 3  miles/hours, or walking 1.5 miles in 30 minutes -recommend avoidance of tobacco products. Avoid excess  alcohol. -continue aspirin, simvastatin for secondary prevention (PVD)  Plan for follow up: 2 weeks given concerns for afib and edema  Jodelle RedBridgette Jef Futch, MD, PhD [place]   [facility]   Medication Adjustments/Labs and Tests Ordered: Current medicines are reviewed at length with the patient today.  Concerns regarding medicines are outlined above.   No orders of the defined types were placed in this encounter.  Meds ordered this encounter  Medications   amLODipine (NORVASC) 5 MG tablet    Sig: Take 1 tablet (5 mg total) by mouth daily.    Dispense:  90 tablet    Refill:  3   spironolactone (ALDACTONE) 25 MG tablet    Sig: Take 1 tablet (25 mg total) by mouth daily.    Dispense:  90 tablet    Refill:  3    Patient Instructions  Medication Instructions:  Start Spironolactone 25 mg daily Restart Amlodipine 5 mg daily  *If you need a refill on your cardiac medications before your next appointment, please call your pharmacy*   Lab Work: None ordered   Testing/Procedures: None ordered    Follow-Up: At [facility], you and your health needs are our priority.  As part of our continuing mission to provide you with exceptional heart care, we have created designated Provider Care Teams.  These Care Teams include your primary Cardiologist (physician) and Advanced Practice Providers (APPs -  Physician Assistants and Nurse Practitioners) who all work together to provide you with the care you need, when you need it.  We recommend signing up for the patient portal called "MyChart".  Sign up information is provided on this After Visit Summary.  MyChart is used to connect with patients for Virtual Visits (Telemedicine).  Patients are able to view lab/test results, encounter notes, upcoming appointments, etc.  Non-urgent messages can be sent to your provider as well.   To learn more about what you can do with MyChart, go to [email or web address].    Your next appointment:    2 week(s)  The format for your next appointment:   In Person  Provider:   Jodelle RedBridgette Arletta Lumadue, MD     Signed, Jodelle RedBridgette Aeralyn Barna, MD PhD 09/26/2020    [facility]

## 2020-09-26 NOTE — Patient Instructions (Signed)
Medication Instructions:  Start Spironolactone 25 mg daily Restart Amlodipine 5 mg daily  *If you need a refill on your cardiac medications before your next appointment, please call your pharmacy*   Lab Work: None ordered   Testing/Procedures: None ordered    Follow-Up: At [facility], you and your health needs are our priority.  As part of our continuing mission to provide you with exceptional heart care, we have created designated Provider Care Teams.  These Care Teams include your primary Cardiologist (physician) and Advanced Practice Providers (APPs -  Physician Assistants and Nurse Practitioners) who all work together to provide you with the care you need, when you need it.  We recommend signing up for the patient portal called "MyChart".  Sign up information is provided on this After Visit Summary.  MyChart is used to connect with patients for Virtual Visits (Telemedicine).  Patients are able to view lab/test results, encounter notes, upcoming appointments, etc.  Non-urgent messages can be sent to your provider as well.   To learn more about what you can do with MyChart, go to [email or web address].    Your next appointment:   2 week(s)  The format for your next appointment:   In Person  Provider:   Jodelle Red, MD

## 2020-09-27 ENCOUNTER — Encounter: Payer: Self-pay | Admitting: Cardiology

## 2020-10-12 ENCOUNTER — Other Ambulatory Visit: Payer: Self-pay

## 2020-10-12 ENCOUNTER — Encounter: Payer: Self-pay | Admitting: Cardiology

## 2020-10-12 ENCOUNTER — Ambulatory Visit: Payer: Medicare Other | Admitting: Cardiology

## 2020-10-12 VITALS — BP 146/80 | HR 65 | Ht 65.0 in | Wt 129.4 lb

## 2020-10-12 DIAGNOSIS — I872 Venous insufficiency (chronic) (peripheral): Secondary | ICD-10-CM

## 2020-10-12 DIAGNOSIS — R600 Localized edema: Secondary | ICD-10-CM

## 2020-10-12 DIAGNOSIS — Z79899 Other long term (current) drug therapy: Secondary | ICD-10-CM

## 2020-10-12 DIAGNOSIS — I4819 Other persistent atrial fibrillation: Secondary | ICD-10-CM | POA: Diagnosis not present

## 2020-10-12 DIAGNOSIS — I10 Essential (primary) hypertension: Secondary | ICD-10-CM

## 2020-10-12 DIAGNOSIS — F17210 Nicotine dependence, cigarettes, uncomplicated: Secondary | ICD-10-CM

## 2020-10-12 NOTE — Patient Instructions (Signed)
Medication Instructions:  Your Physician recommend you continue on your current medication as directed.    *If you need a refill on your cardiac medications before your next appointment, please call your pharmacy*   Lab Work: Your physician recommends that you return for lab work in 1 week ( BMP).     Testing/Procedures: None ordered    Follow-Up: At [facility], you and your health needs are our priority.  As part of our continuing mission to provide you with exceptional heart care, we have created designated Provider Care Teams.  These Care Teams include your primary Cardiologist (physician) and Advanced Practice Providers (APPs -  Physician Assistants and Nurse Practitioners) who all work together to provide you with the care you need, when you need it.  We recommend signing up for the patient portal called "MyChart".  Sign up information is provided on this After Visit Summary.  MyChart is used to connect with patients for Virtual Visits (Telemedicine).  Patients are able to view lab/test results, encounter notes, upcoming appointments, etc.  Non-urgent messages can be sent to your provider as well.   To learn more about what you can do with MyChart, go to [email or web address].    Your next appointment:   November 15, 2020 @ 9:20   The format for your next appointment:   Virtual Visit   Provider:   Jodelle Red, MD

## 2020-10-12 NOTE — Progress Notes (Signed)
Cardiology Office Note:    Date:  10/12/2020   ID:  Macall, Mccroskey 31-Oct-1946, MRN [id number]  PCP:  Clayborn Heron, MD  Cardiologist:  Jodelle Red, MD (prior Dr. Donnie Aho)  Referring MD: Clayborn Heron, MD   CC: follow up  History of Present Illness:    Erin Calderon is a 74 y.o. female with a hx of tobacco use, hypertension, peripheral vascular disease, hyperlipidemia, COPD who is seen in follow up today. I initially saw her as a new patient to me on 11/16/19. She was last seen by Dr. Donnie Aho on 06/18/2018. She is also followed by Dr. Allyson Sabal for PAD.  Summary of notes from Dr. York Spaniel reports: Echo 03/16/2018 EF 65%, moderate cLVH, grade 1 diastolic dysfunction. Mild-moderate TR, mild-moderate PR. No documented stress/cath  Today: Daughter here today with patient. Still with significant LE edema, pitting, left greater than right. Doesn't weigh at home.   Blood pressure slightly improved. Heart rate also improved today.   Overall difficult situation. With hypotension with recent hospital admission, then hypertension on followup with me, trying to balance. Also discussed edema. It is very dependent. Discussed elevation, ambulation. She is working on compression stockings.  Hard to come to appointments, we discussed remote health today. Daughter would like this coordinated through her if possible.   Denies chest pain. Breathing is at her baseline. No PND, orthopnea, or unexpected weight gain. No syncope or palpitations.  Past Medical History:  Diagnosis Date  . Hypertension     No past surgical history on file.  Current Medications: Current Outpatient Medications on File Prior to Visit  Medication Sig  . albuterol (PROVENTIL HFA;VENTOLIN HFA) 108 (90 Base) MCG/ACT inhaler Inhale 2 puffs into the lungs every 6 (six) hours as needed for wheezing or shortness of breath.  Marland Kitchen albuterol (PROVENTIL) (2.5 MG/3ML) 0.083% nebulizer solution Take 2.5 mg by  nebulization every 6 (six) hours as needed for wheezing or shortness of breath.  Marland Kitchen amLODipine (NORVASC) 5 MG tablet Take 1 tablet (5 mg total) by mouth daily.  Marland Kitchen apixaban (ELIQUIS) 5 MG TABS tablet Take 1 tablet (5 mg total) by mouth 2 (two) times daily.  . Cyanocobalamin (VITAMIN B-12) 500 MCG SUBL Place 1,000 mcg under the tongue daily.  . fluticasone (FLONASE) 50 MCG/ACT nasal spray Place 1-2 sprays into both nostrils daily as needed for allergies or rhinitis.  . folic acid (FOLVITE) 400 MCG tablet Take 400 mcg by mouth daily.  Marland Kitchen loperamide (IMODIUM) 2 MG capsule Take 1 capsule (2 mg total) by mouth as needed for diarrhea or loose stools (one capsule after each loose stool (max 4 capsules/24h)).  . metoprolol tartrate (LOPRESSOR) 25 MG tablet Take 0.5 tablets (12.5 mg total) by mouth 2 (two) times daily.  . Multiple Vitamin (MULTIVITAMIN WITH MINERALS) TABS tablet Take 1 tablet by mouth daily.  . pantoprazole (PROTONIX) 40 MG tablet Take 1 tablet (40 mg total) by mouth daily.  . rosuvastatin (CRESTOR) 20 MG tablet Take 1 tablet (20 mg total) by mouth daily.  Marland Kitchen saccharomyces boulardii (FLORASTOR) 250 MG capsule Take 1 capsule (250 mg total) by mouth 2 (two) times daily.  Marland Kitchen spironolactone (ALDACTONE) 25 MG tablet Take 1 tablet (25 mg total) by mouth daily.  . traMADol (ULTRAM) 50 MG tablet Take 50 mg by mouth every 6 (six) hours as needed for moderate pain.  . vitamin E 200 UNIT capsule Take 200 Units by mouth daily.   No current facility-administered medications on file prior  to visit.     Allergies:   Hydrocodone and Latex   Social History   Tobacco Use  . Smoking status: Current Every Day Smoker    Packs/day: 0.50    Types: Cigarettes  . Smokeless tobacco: Never Used  Substance Use Topics  . Alcohol use: Yes  . Drug use: No    Family History: Father deceased, natural causes. Mother deceased, dementia. Sister deceased, CVA/dementia. Sister deceased, cancer.  ROS:   Please see  the history of present illness.  Additional pertinent ROS otherwise unremarkable.  EKGs/Labs/Other Studies Reviewed:    The following studies were reviewed today:  Echo 08/22/20 1. Left ventricular ejection fraction, by estimation, is 60 to 65%. The  left ventricle has normal function. The left ventricle has no regional  wall motion abnormalities. There is moderate left ventricular hypertrophy.  Left ventricular diastolic  parameters are indeterminate.  2. Right ventricular systolic function is normal. The right ventricular  size is normal. There is normal pulmonary artery systolic pressure.  3. The mitral valve is normal in structure. Trivial mitral valve  regurgitation. No evidence of mitral stenosis.  4. The aortic valve is normal in structure. Aortic valve regurgitation is  not visualized. No aortic stenosis is present.  5. The inferior vena cava is normal in size with <50% respiratory  variability, suggesting right atrial pressure of 8 mmHg.  CT angio with runoff 12/04/19 FINDINGS: VASCULAR  Coronary calcifications.  Aorta: Heavily calcified atheromatous plaque. Mild tortuosity. No aneurysm, dissection, or stenosis.  Celiac: Calcified ostial plaque resulting in short segment stenosis of possible hemodynamic significance, patent distally.  SMA: Calcified ostial plaque resulting in short segment stenosis of possible hemodynamic significance, patent distally. Classic distal branch anatomy.  Renals: Single bilaterally, both with heavily calcified ostial plaque resulting in short segment stenosis of possible hemodynamic significance.  IMA: Limited evaluation on this venous phase examination  RIGHT Lower Extremity  Inflow: Heavily calcified plaque throughout the iliac arterial system resulting in tandem regions of at least mild stenosis.  Outflow: Deep femoral branches atheromatous but patent. Diffuse calcified plaque through the SFA and popliteal artery  which remain patent.  Runoff: Heavy calcifications in the visualized proximal tibial runoff, not evaluated distally.  LEFT Lower Extremity  Inflow: Heavily calcified plaque through the iliac arterial system.  Outflow: Calcified plaque through the SFA and popliteal artery which remain patent.  Runoff: Proximal anterior tibial artery and tibioperoneal trunk patent. Not evaluated distally  VEINS  Normal appearance of bilateral common femoral veins, iliac venous system, IVC.  Renal veins patent bilaterally.  Diminutive but patent splenic vein. Patent SMV, portal venous system, and hepatic veins.  No venous pathology is identified.  Review of the MIP images confirms the above findings.  NON-VASCULAR  Lower chest: No pleural or pericardial effusion.  Hepatobiliary: Chronic atrophy in the medial segment left hepatic lobe. No focal liver lesion or biliary ductal dilatation. Subcentimeter partially calcified layering calculi in the dependent aspect of the nondilated gallbladder.  Pancreas: Unremarkable. No pancreatic ductal dilatation or surrounding inflammatory changes.  Spleen: Diminutive, without focal lesion.  Adrenals/Urinary Tract: Normal adrenal glands. No hydronephrosis or evident urolithiasis. Urinary bladder incompletely distended.  Stomach/Bowel: Stomach is nondistended. Small bowel nondilated. Colon is nondistended, unremarkable.  Lymphatic: No abdominal or pelvic adenopathy.  Reproductive: Status post hysterectomy. No adnexal masses.  Other: No ascites. Bilateral pelvic phleboliths. No free air.  Musculoskeletal: Transitional lumbosacral segment assigned S1. 5 non rib-bearing lumbar type vertebral bodies. Chronic compression deformity T12.  L1 compression fracture with some gas between fracture fragments, approximately 30% loss of height anteriorly, no significant retropulsion or posterior element involvement.  L2 compression  fracture with some gas in the fracture, approximately 20% loss of height anteriorly, no significant retropulsion or posterior element involvement. Spondylitic changes T11-L4.  IMPRESSION: 1. No evidence of iliocaval thrombus, compressive pathology, or venous occlusion. 2. L1 and L2 vertebral compression fractures, possibly subacute. Chronic T12 compression deformity. 3. Bilateral lower extremity inflow and outflow arterial occlusive disease of indeterminate hemodynamic significance. Correlate with clinical symptomatology and ABIs. 4. Renal and visceral arterial origin stenoses, of possible hemodynamic significance, not well evaluated on this venous phase study. 5. Cholelithiasis  EKG:  EKG is personally reviewed.  The ekg ordered 08/25/20 demonstrates atrial fibrillation at 92 bpm  Recent Labs: 08/22/2020: TSH 0.923 08/23/2020: Magnesium 2.3 08/24/2020: Hemoglobin 10.6; Platelets 318 08/25/2020: ALT 89; BUN 10; Creatinine, Ser 0.86; Potassium 3.5; Sodium 130  Recent Lipid Panel No results found for: CHOL, TRIG, HDL, CHOLHDL, VLDL, LDLCALC, LDLDIRECT  Physical Exam:    VS:  BP (!) 146/80   Pulse 65   Ht 5\' 5"  (1.651 m)   Wt 129 lb 6.4 oz (58.7 kg)   SpO2 95%   BMI 21.53 kg/m     Wt Readings from Last 3 Encounters:  10/12/20 129 lb 6.4 oz (58.7 kg)  09/26/20 126 lb 12.8 oz (57.5 kg)  08/21/20 110 lb (49.9 kg)   GEN: Well nourished, well developed in no acute distress HEENT: Normal, moist mucous membranes NECK: No JVD sitting upright CARDIAC: rate controlled but irregular rhythm, normal S1 and S2, no rubs or gallops. 1/6 murmur. VASCULAR: Radial pulses 2+ bilaterally. No carotid bruits. Faint DP pulses through edema RESPIRATORY:  Distant breath sounds with prolonged expiratory phase ABDOMEN: Soft, non-tender, non-distended MUSCULOSKELETAL:  Ambulates independently SKIN: Warm and dry. Bilateral pitting edema, 1+ to knee on right side, 1+ to thigh on left with dependent edema  in posterior thigh and no edema anterior thigh NEUROLOGIC:  Alert and oriented x 3. No focal neuro deficits noted. PSYCHIATRIC:  Normal affect   ASSESSMENT:    1. Bilateral leg edema   2. Medication management   3. Hypertension, unspecified type   4. Persistent atrial fibrillation (HCC)   5. Venous insufficiency (chronic) (peripheral)   6. Cigarette smoker    PLAN:    Atrial fibrillation, persistent -tolerating anticoagulation without issues -discussed attempt at rate control vs. Rhythm control. She reports that she is asymptomatic in afib, but I am concerned it may worsen her edema. For now, will continue with rate control. We have discussed cardioversion, but patient would prefer not to. -CHA2DS2/VAS Stroke Risk Points= 4  -continue apixaban  Hypertension: elevated today, but improved from last visit -tolerating amlodipine, spironolactone additions after last visit -recheck BMET. Daughter would love the opportunity to have blood draws at home, as it is challenging to come into the office for visits -continue metoprolol for now, may consider change to carvedilol  LE edema, now with reduced JVD. Dependent nature supports component of venous insufficiency -on spironolactone as above -counseled on salt, fluid,compression, elevation -monitor closely, will call if worsens  Tobacco use, with counseling: not interested in cessation at this time  Arterial calcification, diffuse, with PAD -counseled extensively on cholesterol/plaque formation and what calcium notes -encouraged tobacco cessation, above -continue rosuvastatin 20 mg -aspirin stopped with start of apixaban  Cardiac risk counseling and prevention recommendations: -recommend heart healthy/Mediterranean diet, with whole grains, fruits, vegetable, fish,  lean meats, nuts, and olive oil. Limit salt. -recommend moderate walking, 3-5 times/week for 30-50 minutes each session. Aim for at least 150 minutes.week. Goal should be  pace of 3 miles/hours, or walking 1.5 miles in 30 minutes -recommend avoidance of tobacco products. Avoid excess alcohol. -continue aspirin, simvastatin for secondary prevention (PVD)  Plan for follow up: 1 month. Will try to get remote health assistance for home weights, vitals, PT eval, etc  Jodelle Red, MD, PhD [facility]   Medication Adjustments/Labs and Tests Ordered: Current medicines are reviewed at length with the patient today.  Concerns regarding medicines are outlined above.   Orders Placed This Encounter  Procedures  . Basic metabolic panel   No orders of the defined types were placed in this encounter.   Patient Instructions  Medication Instructions:  Your Physician recommend you continue on your current medication as directed.    *If you need a refill on your cardiac medications before your next appointment, please call your pharmacy*   Lab Work: Your physician recommends that you return for lab work in 1 week ( BMP).     Testing/Procedures: None ordered    Follow-Up: At [facility], you and your health needs are our priority.  As part of our continuing mission to provide you with exceptional heart care, we have created designated Provider Care Teams.  These Care Teams include your primary Cardiologist (physician) and Advanced Practice Providers (APPs -  Physician Assistants and Nurse Practitioners) who all work together to provide you with the care you need, when you need it.  We recommend signing up for the patient portal called "MyChart".  Sign up information is provided on this After Visit Summary.  MyChart is used to connect with patients for Virtual Visits (Telemedicine).  Patients are able to view lab/test results, encounter notes, upcoming appointments, etc.  Non-urgent messages can be sent to your provider as well.   To learn more about what you can do with MyChart, go to [email or web address].    Your next appointment:    November 15, 2020 @ 9:20   The format for your next appointment:   Virtual Visit   Provider:   Jodelle Red, MD      Signed, Jodelle Red, MD PhD 10/12/2020    [facility]

## 2020-10-15 ENCOUNTER — Encounter: Payer: Self-pay | Admitting: Cardiology

## 2020-10-19 ENCOUNTER — Telehealth: Payer: Self-pay | Admitting: Cardiology

## 2020-10-19 NOTE — Telephone Encounter (Signed)
Patient's daughter called about setting up the patient with home health. Please call back

## 2020-10-19 NOTE — Telephone Encounter (Signed)
Patient returned call - patient reports she is worse than she was during 10/12/20 - "feels like sh*t, to be honest" She reports abdominal swelling, swelling in legs has worsened Patient has not tracked daily weights - thinks she has a scale at home? Patient is due for a BMET - asked if she can get it done soon  Per 10/12/20 MD note: LE edema, now with reduced JVD. Dependent nature supports component of venous insufficiency -on spironolactone as above -counseled on salt, fluid,compression, elevation -monitor closely, will call if worsens

## 2020-10-19 NOTE — Telephone Encounter (Signed)
Erin Calderon with [facility] called in regards to the patient. Erin Calderon says patient stated that Dr. Cristal Deer mentioned calling in a dieretic for the patient at the 10/12/2020 appointment. Since then per Erin Calderon, patient has swelling in her legs that is getting worse, swelling in her stomach, as well as SOB even while sitting still. Erin Calderon is calling in to see about getting the dieretic called in as well as making Dr. Cristal Deer aware. I advised to have the patient call back so we can get her on with a triage nurse if necessary, let her know I would still send phone note. Please call patient/advise  Thank you!

## 2020-10-19 NOTE — Telephone Encounter (Signed)
Returned call to patient's daughter  [facility] services were ordered at 10/12/20 visit per MD note Plan for follow up: 1 month. Will try to get remote health assistance for home weights, vitals, PT eval, etc  Routed to [facility] for follow up

## 2020-10-19 NOTE — Telephone Encounter (Signed)
Spoke with patient's daughter She reports patient has not been able to weigh at home - no scale She states her LE edema is stable, not worse She states her breathing is stable, not worse She is using compression stockings, following advice per 10/12/20 note  Will notify MD

## 2020-10-20 ENCOUNTER — Ambulatory Visit: Payer: Medicare Other | Admitting: Cardiology

## 2020-10-20 ENCOUNTER — Other Ambulatory Visit: Payer: Self-pay

## 2020-10-20 ENCOUNTER — Encounter: Payer: Self-pay | Admitting: Cardiology

## 2020-10-20 VITALS — BP 132/63 | HR 98 | Ht 65.0 in | Wt 130.0 lb

## 2020-10-20 DIAGNOSIS — R609 Edema, unspecified: Secondary | ICD-10-CM

## 2020-10-20 DIAGNOSIS — R600 Localized edema: Secondary | ICD-10-CM

## 2020-10-20 DIAGNOSIS — R198 Other specified symptoms and signs involving the digestive system and abdomen: Secondary | ICD-10-CM

## 2020-10-20 DIAGNOSIS — I872 Venous insufficiency (chronic) (peripheral): Secondary | ICD-10-CM

## 2020-10-20 DIAGNOSIS — F17210 Nicotine dependence, cigarettes, uncomplicated: Secondary | ICD-10-CM

## 2020-10-20 DIAGNOSIS — I4819 Other persistent atrial fibrillation: Secondary | ICD-10-CM | POA: Diagnosis not present

## 2020-10-20 MED ORDER — TORSEMIDE 20 MG PO TABS: 20.0000 mg | ORAL_TABLET | Freq: Two times a day (BID) | ORAL | 3 refills | Status: DC

## 2020-10-20 NOTE — Telephone Encounter (Signed)
Pt has an appointment scheduled for today 10/22 with dr. Cristal Deer.

## 2020-10-20 NOTE — Patient Instructions (Signed)
Medication Instructions:  Start Torsemide 20 mg twice a day  *If you need a refill on your cardiac medications before your next appointment, please call your pharmacy*   Lab Work: Your physician recommends that you return for lab work today ( BMP, BNP).  If you have labs (blood work) drawn today and your tests are completely normal, you will receive your results only by: Marland Kitchen MyChart Message (if you have MyChart) OR . A paper copy in the mail If you have any lab test that is abnormal or we need to change your treatment, we will call you to review the results.   Testing/Procedures: None ordered    Follow-Up: At [facility], you and your health needs are our priority.  As part of our continuing mission to provide you with exceptional heart care, we have created designated Provider Care Teams.  These Care Teams include your primary Cardiologist (physician) and Advanced Practice Providers (APPs -  Physician Assistants and Nurse Practitioners) who all work together to provide you with the care you need, when you need it.  We recommend signing up for the patient portal called "MyChart".  Sign up information is provided on this After Visit Summary.  MyChart is used to connect with patients for Virtual Visits (Telemedicine).  Patients are able to view lab/test results, encounter notes, upcoming appointments, etc.  Non-urgent messages can be sent to your provider as well.   To learn more about what you can do with MyChart, go to [email or web address].    Your next appointment:   October 26, 2020 @ 2:20 pm  The format for your next appointment:   In Person  Provider:   Jodelle Red, MD

## 2020-10-20 NOTE — Progress Notes (Signed)
Cardiology Office Note:    Date:  10/20/2020   ID:  Danylle, Ouk 31-May-1946, MRN [id number]  PCP:  Clayborn Heron, MD  Cardiologist:  Jodelle Red, MD (prior Dr. Donnie Aho)  Referring MD: Clayborn Heron, MD   CC: follow up  History of Present Illness:    Erin Calderon is a 74 y.o. female with a hx of tobacco use, hypertension, peripheral vascular disease, hyperlipidemia, COPD who is seen in follow up today. I initially saw her as a new patient to me on 11/16/19. She was last seen by Dr. Donnie Aho on 06/18/2018. She is also followed by Dr. Allyson Sabal for PAD.  Summary of notes from Dr. York Spaniel reports: Echo 03/16/2018 EF 65%, moderate cLVH, grade 1 diastolic dysfunction. Mild-moderate TR, mild-moderate PR. No documented stress/cath  Today: Worsening LE edema. Lower right back pain started last night. Lower abdomen is now firm as well. Having abdominal pain. No nausea, vomiting, or diarrhea. Appetite is fine. No fevers/chills. No pain with urination. Urinating more than usual. Weight is up 1 lb in the last week. Last bowel movement 10/20, was normal.  We discussed my concerns given the progression of her symptoms. Discussed that this may be fluid, but the new abdominal swelling is worrisome. We discussed options at length, see below.  Denies chest pain, shortness of breath at rest or with normal exertion. No syncope or palpitations.  Past Medical History:  Diagnosis Date  . Hypertension     No past surgical history on file.  Current Medications: Current Outpatient Medications on File Prior to Visit  Medication Sig  . albuterol (PROVENTIL HFA;VENTOLIN HFA) 108 (90 Base) MCG/ACT inhaler Inhale 2 puffs into the lungs every 6 (six) hours as needed for wheezing or shortness of breath.  Marland Kitchen albuterol (PROVENTIL) (2.5 MG/3ML) 0.083% nebulizer solution Take 2.5 mg by nebulization every 6 (six) hours as needed for wheezing or shortness of breath.  Marland Kitchen amLODipine  (NORVASC) 5 MG tablet Take 1 tablet (5 mg total) by mouth daily.  Marland Kitchen apixaban (ELIQUIS) 5 MG TABS tablet Take 1 tablet (5 mg total) by mouth 2 (two) times daily.  . Cyanocobalamin (VITAMIN B-12) 500 MCG SUBL Place 1,000 mcg under the tongue daily.  . fluticasone (FLONASE) 50 MCG/ACT nasal spray Place 1-2 sprays into both nostrils daily as needed for allergies or rhinitis.  . folic acid (FOLVITE) 400 MCG tablet Take 400 mcg by mouth daily.  Marland Kitchen loperamide (IMODIUM) 2 MG capsule Take 1 capsule (2 mg total) by mouth as needed for diarrhea or loose stools (one capsule after each loose stool (max 4 capsules/24h)).  . metoprolol tartrate (LOPRESSOR) 25 MG tablet Take 0.5 tablets (12.5 mg total) by mouth 2 (two) times daily.  . Multiple Vitamin (MULTIVITAMIN WITH MINERALS) TABS tablet Take 1 tablet by mouth daily.  . pantoprazole (PROTONIX) 40 MG tablet Take 1 tablet (40 mg total) by mouth daily.  . rosuvastatin (CRESTOR) 20 MG tablet Take 1 tablet (20 mg total) by mouth daily.  Marland Kitchen saccharomyces boulardii (FLORASTOR) 250 MG capsule Take 1 capsule (250 mg total) by mouth 2 (two) times daily.  Marland Kitchen spironolactone (ALDACTONE) 25 MG tablet Take 1 tablet (25 mg total) by mouth daily.  . traMADol (ULTRAM) 50 MG tablet Take 50 mg by mouth every 6 (six) hours as needed for moderate pain.  . vitamin E 200 UNIT capsule Take 200 Units by mouth daily.   No current facility-administered medications on file prior to visit.  Allergies:   Hydrocodone and Latex   Social History   Tobacco Use  . Smoking status: Current Every Day Smoker    Packs/day: 0.50    Types: Cigarettes  . Smokeless tobacco: Never Used  Substance Use Topics  . Alcohol use: Yes  . Drug use: No    Family History: Father deceased, natural causes. Mother deceased, dementia. Sister deceased, CVA/dementia. Sister deceased, cancer.  ROS:   Please see the history of present illness.  Additional pertinent ROS otherwise  unremarkable.  EKGs/Labs/Other Studies Reviewed:    The following studies were reviewed today:  Echo 08/22/20 1. Left ventricular ejection fraction, by estimation, is 60 to 65%. The  left ventricle has normal function. The left ventricle has no regional  wall motion abnormalities. There is moderate left ventricular hypertrophy.  Left ventricular diastolic  parameters are indeterminate.  2. Right ventricular systolic function is normal. The right ventricular  size is normal. There is normal pulmonary artery systolic pressure.  3. The mitral valve is normal in structure. Trivial mitral valve  regurgitation. No evidence of mitral stenosis.  4. The aortic valve is normal in structure. Aortic valve regurgitation is  not visualized. No aortic stenosis is present.  5. The inferior vena cava is normal in size with <50% respiratory  variability, suggesting right atrial pressure of 8 mmHg.  CT angio with runoff 12/04/19 FINDINGS: VASCULAR  Coronary calcifications.  Aorta: Heavily calcified atheromatous plaque. Mild tortuosity. No aneurysm, dissection, or stenosis.  Celiac: Calcified ostial plaque resulting in short segment stenosis of possible hemodynamic significance, patent distally.  SMA: Calcified ostial plaque resulting in short segment stenosis of possible hemodynamic significance, patent distally. Classic distal branch anatomy.  Renals: Single bilaterally, both with heavily calcified ostial plaque resulting in short segment stenosis of possible hemodynamic significance.  IMA: Limited evaluation on this venous phase examination  RIGHT Lower Extremity  Inflow: Heavily calcified plaque throughout the iliac arterial system resulting in tandem regions of at least mild stenosis.  Outflow: Deep femoral branches atheromatous but patent. Diffuse calcified plaque through the SFA and popliteal artery which remain patent.  Runoff: Heavy calcifications in the  visualized proximal tibial runoff, not evaluated distally.  LEFT Lower Extremity  Inflow: Heavily calcified plaque through the iliac arterial system.  Outflow: Calcified plaque through the SFA and popliteal artery which remain patent.  Runoff: Proximal anterior tibial artery and tibioperoneal trunk patent. Not evaluated distally  VEINS  Normal appearance of bilateral common femoral veins, iliac venous system, IVC.  Renal veins patent bilaterally.  Diminutive but patent splenic vein. Patent SMV, portal venous system, and hepatic veins.  No venous pathology is identified.  Review of the MIP images confirms the above findings.  NON-VASCULAR  Lower chest: No pleural or pericardial effusion.  Hepatobiliary: Chronic atrophy in the medial segment left hepatic lobe. No focal liver lesion or biliary ductal dilatation. Subcentimeter partially calcified layering calculi in the dependent aspect of the nondilated gallbladder.  Pancreas: Unremarkable. No pancreatic ductal dilatation or surrounding inflammatory changes.  Spleen: Diminutive, without focal lesion.  Adrenals/Urinary Tract: Normal adrenal glands. No hydronephrosis or evident urolithiasis. Urinary bladder incompletely distended.  Stomach/Bowel: Stomach is nondistended. Small bowel nondilated. Colon is nondistended, unremarkable.  Lymphatic: No abdominal or pelvic adenopathy.  Reproductive: Status post hysterectomy. No adnexal masses.  Other: No ascites. Bilateral pelvic phleboliths. No free air.  Musculoskeletal: Transitional lumbosacral segment assigned S1. 5 non rib-bearing lumbar type vertebral bodies. Chronic compression deformity T12.  L1 compression fracture with some gas  between fracture fragments, approximately 30% loss of height anteriorly, no significant retropulsion or posterior element involvement.  L2 compression fracture with some gas in the fracture, approximately 20%  loss of height anteriorly, no significant retropulsion or posterior element involvement. Spondylitic changes T11-L4.  IMPRESSION: 1. No evidence of iliocaval thrombus, compressive pathology, or venous occlusion. 2. L1 and L2 vertebral compression fractures, possibly subacute. Chronic T12 compression deformity. 3. Bilateral lower extremity inflow and outflow arterial occlusive disease of indeterminate hemodynamic significance. Correlate with clinical symptomatology and ABIs. 4. Renal and visceral arterial origin stenoses, of possible hemodynamic significance, not well evaluated on this venous phase study. 5. Cholelithiasis  EKG:  EKG is personally reviewed.  The ekg ordered 08/25/20 demonstrates atrial fibrillation at 92 bpm  Recent Labs: 08/22/2020: TSH 0.923 08/23/2020: Magnesium 2.3 08/24/2020: Hemoglobin 10.6; Platelets 318 08/25/2020: ALT 89; BUN 10; Creatinine, Ser 0.86; Potassium 3.5; Sodium 130  Recent Lipid Panel No results found for: CHOL, TRIG, HDL, CHOLHDL, VLDL, LDLCALC, LDLDIRECT  Physical Exam:    VS:  BP 132/63   Pulse 98   Ht 5\' 5"  (1.651 m)   Wt 130 lb (59 kg)   SpO2 96%   BMI 21.63 kg/m     Wt Readings from Last 3 Encounters:  10/20/20 130 lb (59 kg)  10/12/20 129 lb 6.4 oz (58.7 kg)  09/26/20 126 lb 12.8 oz (57.5 kg)   GEN: Well nourished, well developed in no acute distress HEENT: Normal, moist mucous membranes NECK: JVD at clavicle sitting upright CARDIAC: rate controlled but irregular rhythm, normal S1 and S2, no rubs or gallops. 1/6 systolic murmur. VASCULAR: Radial pulses 2+ bilaterally. No carotid bruits RESPIRATORY:  Distant breath sounds with prolonged expiratory phase ABDOMEN: Upper abdomen soft, but the lower abdomen is firm and mildly tender. The overlying skin is not swollen/edematous; firmness is throughout abdomen but intra-abdominal. Smooth. Slight fluid wave MUSCULOSKELETAL:  Ambulates independently SKIN: Bilateral pitting edema similar  to prior, 1+ on right side to knee, 1+ on left side to thigh with dependent edema in posterior thigh NEUROLOGIC:  Alert and oriented x 3. No focal neuro deficits noted. PSYCHIATRIC:  Normal affect   ASSESSMENT:    No diagnosis found. PLAN:    LE edema: continues to worsen. Concerning today that JVD is visible again and abdomen is full.  -with abdominal swelling, will change lasix to torsemide -will check BMET and BNP today -will plan for close follow up in 1 week -spoke to daughter at length. If symptoms do not improve significantly on torsemide, she may need evaluation such as CT with Dr. 09/28/20 to determine if there is another etiology. CT in 07/2020 did not show obstruction, but liver contours suggested underlying cirrhosis. This could potential also lead to abdominal distention and swelling. They will discuss with Dr. 08/2020. -on spironolactone as above -counseled on salt, fluid,compression, elevation as there is at least a partial component of venous insufficiency -monitor closely, will call if worsens  Atrial fibrillation, persistent -tolerating anticoagulation without issues -discussed attempt at rate control vs. Rhythm control. She reports that she is asymptomatic in afib, but I am concerned it may worsen her edema. For now, will continue with rate control. We have discussed cardioversion, but patient would prefer not to. -CHA2DS2/VAS Stroke Risk Points= 4  -continue apixaban  Hypertension:reasonable control today -tolerating amlodipine, spironolactone -changing to torsemide as above -continue metoprolol for now, may consider change to carvedilol  Tobacco use, with counseling: not interested in cessation at this time  Arterial  calcification, diffuse, with PAD -counseled extensively on cholesterol/plaque formation and what calcium notes -encouraged tobacco cessation, above -continue rosuvastatin 20 mg -aspirin stopped with start of apixaban  Cardiac risk counseling and  prevention recommendations: -recommend heart healthy/Mediterranean diet, with whole grains, fruits, vegetable, fish, lean meats, nuts, and olive oil. Limit salt. -recommend moderate walking, 3-5 times/week for 30-50 minutes each session. Aim for at least 150 minutes.week. Goal should be pace of 3 miles/hours, or walking 1.5 miles in 30 minutes -recommend avoidance of tobacco products. Avoid excess alcohol. -continue aspirin, simvastatin for secondary prevention (PVD)  Plan for follow up: needs very close follow up. Arranged for appt 10/26/20  Jodelle Red, MD, PhD [place]  [facility]   Medication Adjustments/Labs and Tests Ordered: Current medicines are reviewed at length with the patient today.  Concerns regarding medicines are outlined above.   Orders Placed This Encounter  Procedures  . Brain natriuretic peptide  . Basic metabolic panel   Meds ordered this encounter  Medications  . torsemide (DEMADEX) 20 MG tablet    Sig: Take 1 tablet (20 mg total) by mouth 2 (two) times daily.    Dispense:  60 tablet    Refill:  3    Patient Instructions  Medication Instructions:  Start Torsemide 20 mg twice a day  *If you need a refill on your cardiac medications before your next appointment, please call your pharmacy*   Lab Work: Your physician recommends that you return for lab work today ( BMP, BNP).  If you have labs (blood work) drawn today and your tests are completely normal, you will receive your results only by: Marland Kitchen MyChart Message (if you have MyChart) OR . A paper copy in the mail If you have any lab test that is abnormal or we need to change your treatment, we will call you to review the results.   Testing/Procedures: None ordered    Follow-Up: At [facility], you and your health needs are our priority.  As part of our continuing mission to provide you with exceptional heart care, we have created designated Provider Care Teams.  These Care Teams  include your primary Cardiologist (physician) and Advanced Practice Providers (APPs -  Physician Assistants and Nurse Practitioners) who all work together to provide you with the care you need, when you need it.  We recommend signing up for the patient portal called "MyChart".  Sign up information is provided on this After Visit Summary.  MyChart is used to connect with patients for Virtual Visits (Telemedicine).  Patients are able to view lab/test results, encounter notes, upcoming appointments, etc.  Non-urgent messages can be sent to your provider as well.   To learn more about what you can do with MyChart, go to [email or web address].    Your next appointment:   October 26, 2020 @ 2:20 pm  The format for your next appointment:   In Person  Provider:   Jodelle Red, MD      Signed, Jodelle Red, MD PhD 10/20/2020    [facility]

## 2020-10-21 ENCOUNTER — Telehealth: Payer: Self-pay | Admitting: Cardiology

## 2020-10-21 LAB — BASIC METABOLIC PANEL
BUN/Creatinine Ratio: 11 — ABNORMAL LOW (ref 12–28)
BUN: 10 mg/dL (ref 8–27)
CO2: 26 mmol/L (ref 20–29)
Calcium: 9.5 mg/dL (ref 8.7–10.3)
Chloride: 101 mmol/L (ref 96–106)
Creatinine, Ser: 0.89 mg/dL (ref 0.57–1.00)
GFR calc Af Amer: 73 mL/min/{1.73_m2} (ref >59)
GFR calc non Af Amer: 64 mL/min/{1.73_m2} (ref >59)
Glucose: 93 mg/dL (ref 65–99)
Potassium: 4.5 mmol/L (ref 3.5–5.2)
Sodium: 139 mmol/L (ref 134–144)

## 2020-10-21 LAB — BRAIN NATRIURETIC PEPTIDE: BNP: 495.5 pg/mL — ABNORMAL HIGH (ref 0.0–100.0)

## 2020-10-21 NOTE — Telephone Encounter (Signed)
Called patient, and her daughter Lowella Bandy answered. Reviewed that both the BMET looked good and the BNP was elevated. Discussed that we will continue with the plan of torsemide BID and close follow up. Lowella Bandy reports that Ms. Ernster is making good urine, though legs and abdomen still swollen. We will continue current plan, and they will contact me with any issues in the interim.  Jodelle Red, MD, PhD [facility]  [address], [address] [place], [place] [address] [phone]

## 2020-10-23 NOTE — Telephone Encounter (Signed)
Patient's daughter is following up. She states the patient has not had a bowel movement since 10/20/20. She would like to know if there is an over-the-counter medication that the patient may be eligible to take to assist with regularity. Please advise.

## 2020-10-23 NOTE — Telephone Encounter (Signed)
Spoke with the patient's daughter and advised her that she could try an over the counter stool softener. I recommended that if she is still having trouble after this then she should contact the patient's PCP.

## 2020-10-26 ENCOUNTER — Ambulatory Visit: Payer: Medicare Other | Admitting: Cardiology

## 2020-10-26 DIAGNOSIS — G8929 Other chronic pain: Secondary | ICD-10-CM | POA: Diagnosis not present

## 2020-10-26 DIAGNOSIS — J449 Chronic obstructive pulmonary disease, unspecified: Secondary | ICD-10-CM | POA: Diagnosis not present

## 2020-10-26 DIAGNOSIS — I480 Paroxysmal atrial fibrillation: Secondary | ICD-10-CM | POA: Diagnosis not present

## 2020-10-26 DIAGNOSIS — E7800 Pure hypercholesterolemia, unspecified: Secondary | ICD-10-CM | POA: Diagnosis not present

## 2020-10-26 DIAGNOSIS — I10 Essential (primary) hypertension: Secondary | ICD-10-CM | POA: Diagnosis not present

## 2020-10-27 ENCOUNTER — Encounter: Payer: Self-pay | Admitting: Cardiology

## 2020-10-28 ENCOUNTER — Other Ambulatory Visit: Payer: Self-pay | Admitting: Cardiology

## 2020-11-01 ENCOUNTER — Other Ambulatory Visit: Payer: Self-pay | Admitting: Cardiology

## 2020-11-01 NOTE — Telephone Encounter (Signed)
*  STAT* If patient is at the pharmacy, call can be transferred to refill team.   1. Which medications need to be refilled? (please list name of each medication and dose if known) Eliquis  2. Which pharmacy/location (including street and city if local pharmacy) is medication to be sent to? [facility]  3. Do they need a 30 day or 90 day supply? *90 days and refills

## 2020-11-02 MED ORDER — APIXABAN 5 MG PO TABS: 5.0000 mg | ORAL_TABLET | Freq: Two times a day (BID) | ORAL | 1 refills | Status: AC

## 2020-11-03 ENCOUNTER — Ambulatory Visit: Payer: Medicare Other | Admitting: Cardiology

## 2020-11-03 ENCOUNTER — Encounter: Payer: Self-pay | Admitting: Cardiology

## 2020-11-03 ENCOUNTER — Other Ambulatory Visit: Payer: Self-pay

## 2020-11-03 VITALS — BP 128/82 | Temp 98.0°F | Ht 64.0 in | Wt 110.8 lb

## 2020-11-03 DIAGNOSIS — R198 Other specified symptoms and signs involving the digestive system and abdomen: Secondary | ICD-10-CM | POA: Diagnosis not present

## 2020-11-03 DIAGNOSIS — R609 Edema, unspecified: Secondary | ICD-10-CM | POA: Diagnosis not present

## 2020-11-03 DIAGNOSIS — F17210 Nicotine dependence, cigarettes, uncomplicated: Secondary | ICD-10-CM

## 2020-11-03 DIAGNOSIS — I4819 Other persistent atrial fibrillation: Secondary | ICD-10-CM | POA: Diagnosis not present

## 2020-11-03 DIAGNOSIS — R600 Localized edema: Secondary | ICD-10-CM

## 2020-11-03 DIAGNOSIS — Z79899 Other long term (current) drug therapy: Secondary | ICD-10-CM

## 2020-11-03 MED ORDER — TORSEMIDE 20 MG PO TABS: 20.0000 mg | ORAL_TABLET | Freq: Every day | ORAL | 3 refills | Status: AC

## 2020-11-03 NOTE — Progress Notes (Signed)
Cardiology Office Note:    Date:  11/03/2020   ID:  Erin, Calderon 1946-07-02, MRN [id number]  PCP:  Clayborn Heron, MD  Cardiologist:  Jodelle Red, MD (prior Dr. Donnie Aho)  Referring MD: Clayborn Heron, MD   CC: follow up  History of Present Illness:    Erin Calderon is a 74 y.o. female with a hx of tobacco use, hypertension, peripheral vascular disease, hyperlipidemia, COPD who is seen in follow up today. I initially saw her as a new patient to me on 11/16/19. She was last seen by Dr. Donnie Aho on 06/18/2018. She is also followed by Dr. Allyson Sabal for PAD.  Summary of notes from Dr. York Spaniel reports: Echo 03/16/2018 EF 65%, moderate cLVH, grade 1 diastolic dysfunction. Mild-moderate TR, mild-moderate PR. No documented stress/cath  Today: Swelling improved on torsemide. Tolerating well. Weight on our scale down significantly today. Discussed rechecking labs given diuresis.   Overall feels somewhat improved.  Denies chest pain, shortness of breath at rest. No PND, orthopnea, or unexpected weight gain. No syncope or palpitations.  Past Medical History:  Diagnosis Date  . Hypertension     No past surgical history on file.  Current Medications: Current Outpatient Medications on File Prior to Visit  Medication Sig  . albuterol (PROVENTIL HFA;VENTOLIN HFA) 108 (90 Base) MCG/ACT inhaler Inhale 2 puffs into the lungs every 6 (six) hours as needed for wheezing or shortness of breath.  Marland Kitchen albuterol (PROVENTIL) (2.5 MG/3ML) 0.083% nebulizer solution Take 2.5 mg by nebulization every 6 (six) hours as needed for wheezing or shortness of breath.  Marland Kitchen amLODipine (NORVASC) 5 MG tablet Take 1 tablet (5 mg total) by mouth daily.  Marland Kitchen apixaban (ELIQUIS) 5 MG TABS tablet Take 1 tablet (5 mg total) by mouth 2 (two) times daily.  . Cyanocobalamin (VITAMIN B-12) 500 MCG SUBL Place 1,000 mcg under the tongue daily.  . fluticasone (FLONASE) 50 MCG/ACT nasal spray Place 1-2 sprays  into both nostrils daily as needed for allergies or rhinitis.  . folic acid (FOLVITE) 400 MCG tablet Take 400 mcg by mouth daily.  Marland Kitchen loperamide (IMODIUM) 2 MG capsule Take 1 capsule (2 mg total) by mouth as needed for diarrhea or loose stools (one capsule after each loose stool (max 4 capsules/24h)).  . metoprolol tartrate (LOPRESSOR) 25 MG tablet Take 0.5 tablets (12.5 mg total) by mouth 2 (two) times daily.  . Multiple Vitamin (MULTIVITAMIN WITH MINERALS) TABS tablet Take 1 tablet by mouth daily.  . pantoprazole (PROTONIX) 40 MG tablet Take 1 tablet (40 mg total) by mouth daily.  . rosuvastatin (CRESTOR) 20 MG tablet TAKE ONE TABLET BY MOUTH EVERY MORNING  . saccharomyces boulardii (FLORASTOR) 250 MG capsule Take 1 capsule (250 mg total) by mouth 2 (two) times daily.  Marland Kitchen spironolactone (ALDACTONE) 25 MG tablet Take 1 tablet (25 mg total) by mouth daily.  Marland Kitchen torsemide (DEMADEX) 20 MG tablet Take 1 tablet (20 mg total) by mouth 2 (two) times daily.  . traMADol (ULTRAM) 50 MG tablet Take 50 mg by mouth every 6 (six) hours as needed for moderate pain.  . vitamin E 200 UNIT capsule Take 200 Units by mouth daily.   No current facility-administered medications on file prior to visit.     Allergies:   Hydrocodone and Latex   Social History   Tobacco Use  . Smoking status: Current Every Day Smoker    Packs/day: 0.50    Types: Cigarettes  . Smokeless tobacco: Never Used  Substance  Use Topics  . Alcohol use: Yes  . Drug use: No    Family History: Father deceased, natural causes. Mother deceased, dementia. Sister deceased, CVA/dementia. Sister deceased, cancer.  ROS:   Please see the history of present illness.  Additional pertinent ROS otherwise unremarkable.  EKGs/Labs/Other Studies Reviewed:    The following studies were reviewed today:  Echo 08/22/20 1. Left ventricular ejection fraction, by estimation, is 60 to 65%. The  left ventricle has normal function. The left ventricle has no  regional  wall motion abnormalities. There is moderate left ventricular hypertrophy.  Left ventricular diastolic  parameters are indeterminate.  2. Right ventricular systolic function is normal. The right ventricular  size is normal. There is normal pulmonary artery systolic pressure.  3. The mitral valve is normal in structure. Trivial mitral valve  regurgitation. No evidence of mitral stenosis.  4. The aortic valve is normal in structure. Aortic valve regurgitation is  not visualized. No aortic stenosis is present.  5. The inferior vena cava is normal in size with <50% respiratory  variability, suggesting right atrial pressure of 8 mmHg.  CT angio with runoff 12/04/19 FINDINGS: VASCULAR  Coronary calcifications.  Aorta: Heavily calcified atheromatous plaque. Mild tortuosity. No aneurysm, dissection, or stenosis.  Celiac: Calcified ostial plaque resulting in short segment stenosis of possible hemodynamic significance, patent distally.  SMA: Calcified ostial plaque resulting in short segment stenosis of possible hemodynamic significance, patent distally. Classic distal branch anatomy.  Renals: Single bilaterally, both with heavily calcified ostial plaque resulting in short segment stenosis of possible hemodynamic significance.  IMA: Limited evaluation on this venous phase examination  RIGHT Lower Extremity  Inflow: Heavily calcified plaque throughout the iliac arterial system resulting in tandem regions of at least mild stenosis.  Outflow: Deep femoral branches atheromatous but patent. Diffuse calcified plaque through the SFA and popliteal artery which remain patent.  Runoff: Heavy calcifications in the visualized proximal tibial runoff, not evaluated distally.  LEFT Lower Extremity  Inflow: Heavily calcified plaque through the iliac arterial system.  Outflow: Calcified plaque through the SFA and popliteal artery which remain patent.  Runoff:  Proximal anterior tibial artery and tibioperoneal trunk patent. Not evaluated distally  VEINS  Normal appearance of bilateral common femoral veins, iliac venous system, IVC.  Renal veins patent bilaterally.  Diminutive but patent splenic vein. Patent SMV, portal venous system, and hepatic veins.  No venous pathology is identified.  Review of the MIP images confirms the above findings.  NON-VASCULAR  Lower chest: No pleural or pericardial effusion.  Hepatobiliary: Chronic atrophy in the medial segment left hepatic lobe. No focal liver lesion or biliary ductal dilatation. Subcentimeter partially calcified layering calculi in the dependent aspect of the nondilated gallbladder.  Pancreas: Unremarkable. No pancreatic ductal dilatation or surrounding inflammatory changes.  Spleen: Diminutive, without focal lesion.  Adrenals/Urinary Tract: Normal adrenal glands. No hydronephrosis or evident urolithiasis. Urinary bladder incompletely distended.  Stomach/Bowel: Stomach is nondistended. Small bowel nondilated. Colon is nondistended, unremarkable.  Lymphatic: No abdominal or pelvic adenopathy.  Reproductive: Status post hysterectomy. No adnexal masses.  Other: No ascites. Bilateral pelvic phleboliths. No free air.  Musculoskeletal: Transitional lumbosacral segment assigned S1. 5 non rib-bearing lumbar type vertebral bodies. Chronic compression deformity T12.  L1 compression fracture with some gas between fracture fragments, approximately 30% loss of height anteriorly, no significant retropulsion or posterior element involvement.  L2 compression fracture with some gas in the fracture, approximately 20% loss of height anteriorly, no significant retropulsion or posterior element involvement. Spondylitic  changes T11-L4.  IMPRESSION: 1. No evidence of iliocaval thrombus, compressive pathology, or venous occlusion. 2. L1 and L2 vertebral compression  fractures, possibly subacute. Chronic T12 compression deformity. 3. Bilateral lower extremity inflow and outflow arterial occlusive disease of indeterminate hemodynamic significance. Correlate with clinical symptomatology and ABIs. 4. Renal and visceral arterial origin stenoses, of possible hemodynamic significance, not well evaluated on this venous phase study. 5. Cholelithiasis  EKG:  EKG is personally reviewed.  The ekg ordered 08/25/20 demonstrates atrial fibrillation at 92 bpm  Recent Labs: 08/22/2020: TSH 0.923 08/23/2020: Magnesium 2.3 08/24/2020: Hemoglobin 10.6; Platelets 318 08/25/2020: ALT 89 10/20/2020: BNP 495.5; BUN 10; Creatinine, Ser 0.89; Potassium 4.5; Sodium 139  Recent Lipid Panel No results found for: CHOL, TRIG, HDL, CHOLHDL, VLDL, LDLCALC, LDLDIRECT  Physical Exam:    VS:  BP 128/82   Temp 98 F (36.7 C)   Ht 5\' 4"  (1.626 m)   Wt 110 lb 12.8 oz (50.3 kg)   BMI 19.02 kg/m     Wt Readings from Last 3 Encounters:  11/03/20 110 lb 12.8 oz (50.3 kg)  10/20/20 130 lb (59 kg)  10/12/20 129 lb 6.4 oz (58.7 kg)   GEN: Well nourished, well developed in no acute distress HEENT: Normal, moist mucous membranes NECK: No JVD CARDIAC: irregularly irregular rhythm, normal S1 and S2, no rubs or gallops. 1/6 systolic murmur. VASCULAR: Radial and DP pulses 2+ bilaterally.  RESPIRATORY:  Distant breath sounds, no rales ABDOMEN: Soft, non-tender, less distended than prior MUSCULOSKELETAL:  Ambulates independently SKIN: Warm and dry, bilateral LE edema is pitting and dependent, but improved from prior NEUROLOGIC:  Alert and oriented x 3. No focal neuro deficits noted. PSYCHIATRIC:  Normal affect   ASSESSMENT:    1. Bilateral leg edema   2. Edema, unspecified type   3. Medication management   4. Abdominal fullness   5. Persistent atrial fibrillation (HCC)   6. Cigarette smoker    PLAN:    LE edema, abdominal fullness  -improved on torsemide. Will change to daily  torsemide with PRN extra dose for weight gain/swelling -will check BMET today given that exam, weight change suggests significant diuresis -continue spironolactone -counseled on salt, fluid,compression, elevation as there is at least a partial component of venous insufficiency -monitor closely, will call if worsens  Atrial fibrillation, persistent -tolerating anticoagulation without issues -discussed attempt at rate control vs. Rhythm control. She reports that she is asymptomatic in afib, but I am concerned it may worsen her edema. For now, will continue with rate control. We have discussed cardioversion, but patient would prefer not to. -CHA2DS2/VAS Stroke Risk Points= 4  -continue apixaban  Hypertension:reasonable at goal today -tolerating amlodipine, spironolactone, torsemide -continue metoprolol for now, may consider change to carvedilol in BP rises at follow up  Tobacco use, with counseling: not interested in cessation at this time  Arterial calcification, diffuse, with PAD -counseled extensively on cholesterol/plaque formation and what calcium notes -encouraged tobacco cessation, above -continue rosuvastatin 20 mg -aspirin stopped with start of apixaban  Cardiac risk counseling and prevention recommendations: -recommend heart healthy/Mediterranean diet, with whole grains, fruits, vegetable, fish, lean meats, nuts, and olive oil. Limit salt. -recommend moderate walking, 3-5 times/week for 30-50 minutes each session. Aim for at least 150 minutes.week. Goal should be pace of 3 miles/hours, or walking 1.5 miles in 30 minutes -recommend avoidance of tobacco products. Avoid excess alcohol. -continue aspirin, simvastatin for secondary prevention (PVD)  Plan for follow up: 2 mos or sooner as needed  Jodelle Red, MD, PhD [place]  [facility]   Medication Adjustments/Labs and Tests Ordered: Current medicines are reviewed at length with the patient today.  Concerns  regarding medicines are outlined above.   Orders Placed This Encounter  Procedures  . Basic metabolic panel   Meds ordered this encounter  Medications  . torsemide (DEMADEX) 20 MG tablet    Sig: Take 1 tablet (20 mg total) by mouth daily.    Dispense:  90 tablet    Refill:  3    Patient Instructions  Medication Instructions:  Change to one torsemide every morning. You can take a second dose rarely as needed in the afternoon for weight gain or swelling, but if you are taking this frequently please call me.  *If you need a refill on your cardiac medications before your next appointment, please call your pharmacy*   Lab Work: Your physician recommends that you return for lab work today ( BMP).  If you have labs (blood work) drawn today and your tests are completely normal, you will receive your results only by: Marland Kitchen MyChart Message (if you have MyChart) OR . A paper copy in the mail If you have any lab test that is abnormal or we need to change your treatment, we will call you to review the results.   Testing/Procedures: None   Follow-Up: At [facility], you and your health needs are our priority.  As part of our continuing mission to provide you with exceptional heart care, we have created designated Provider Care Teams.  These Care Teams include your primary Cardiologist (physician) and Advanced Practice Providers (APPs -  Physician Assistants and Nurse Practitioners) who all work together to provide you with the care you need, when you need it.  We recommend signing up for the patient portal called "MyChart".  Sign up information is provided on this After Visit Summary.  MyChart is used to connect with patients for Virtual Visits (Telemedicine).  Patients are able to view lab/test results, encounter notes, upcoming appointments, etc.  Non-urgent messages can be sent to your provider as well.   To learn more about what you can do with MyChart, go to [email or web address].     Your next appointment:   2 month(s)  The format for your next appointment:   In Person  Provider:   Jodelle Red, MD      Signed, Jodelle Red, MD PhD 11/03/2020    [facility]

## 2020-11-03 NOTE — Patient Instructions (Addendum)
Medication Instructions:  Change to one torsemide every morning. You can take a second dose rarely as needed in the afternoon for weight gain or swelling, but if you are taking this frequently please call me.  *If you need a refill on your cardiac medications before your next appointment, please call your pharmacy*   Lab Work: Your physician recommends that you return for lab work today ( BMP).  If you have labs (blood work) drawn today and your tests are completely normal, you will receive your results only by: Marland Kitchen MyChart Message (if you have MyChart) OR . A paper copy in the mail If you have any lab test that is abnormal or we need to change your treatment, we will call you to review the results.   Testing/Procedures: None   Follow-Up: At [facility], you and your health needs are our priority.  As part of our continuing mission to provide you with exceptional heart care, we have created designated Provider Care Teams.  These Care Teams include your primary Cardiologist (physician) and Advanced Practice Providers (APPs -  Physician Assistants and Nurse Practitioners) who all work together to provide you with the care you need, when you need it.  We recommend signing up for the patient portal called "MyChart".  Sign up information is provided on this After Visit Summary.  MyChart is used to connect with patients for Virtual Visits (Telemedicine).  Patients are able to view lab/test results, encounter notes, upcoming appointments, etc.  Non-urgent messages can be sent to your provider as well.   To learn more about what you can do with MyChart, go to [email or web address].    Your next appointment:   2 month(s)  The format for your next appointment:   In Person  Provider:   Jodelle Red, MD

## 2020-11-04 LAB — BASIC METABOLIC PANEL
BUN/Creatinine Ratio: 15 (ref 12–28)
BUN: 20 mg/dL (ref 8–27)
CO2: 31 mmol/L — ABNORMAL HIGH (ref 20–29)
Calcium: 9.7 mg/dL (ref 8.7–10.3)
Chloride: 83 mmol/L — ABNORMAL LOW (ref 96–106)
Creatinine, Ser: 1.34 mg/dL — ABNORMAL HIGH (ref 0.57–1.00)
GFR calc Af Amer: 45 mL/min/{1.73_m2} — ABNORMAL LOW (ref >59)
GFR calc non Af Amer: 39 mL/min/{1.73_m2} — ABNORMAL LOW (ref >59)
Glucose: 113 mg/dL — ABNORMAL HIGH (ref 65–99)
Potassium: 3.2 mmol/L — ABNORMAL LOW (ref 3.5–5.2)
Sodium: 131 mmol/L — ABNORMAL LOW (ref 134–144)

## 2020-11-10 NOTE — Progress Notes (Signed)
Can we ask her to come back to recheck her labs again? I want to make sure her potassium and Cr are improving. Thanks.

## 2020-11-11 ENCOUNTER — Other Ambulatory Visit: Payer: Medicare Other

## 2020-11-13 ENCOUNTER — Other Ambulatory Visit: Payer: Self-pay

## 2020-11-13 DIAGNOSIS — G8929 Other chronic pain: Secondary | ICD-10-CM | POA: Diagnosis not present

## 2020-11-13 DIAGNOSIS — Z79899 Other long term (current) drug therapy: Secondary | ICD-10-CM

## 2020-11-13 DIAGNOSIS — M810 Age-related osteoporosis without current pathological fracture: Secondary | ICD-10-CM | POA: Diagnosis not present

## 2020-11-13 DIAGNOSIS — J449 Chronic obstructive pulmonary disease, unspecified: Secondary | ICD-10-CM | POA: Diagnosis not present

## 2020-11-13 DIAGNOSIS — I10 Essential (primary) hypertension: Secondary | ICD-10-CM | POA: Diagnosis not present

## 2020-11-13 DIAGNOSIS — K219 Gastro-esophageal reflux disease without esophagitis: Secondary | ICD-10-CM | POA: Diagnosis not present

## 2020-11-15 ENCOUNTER — Telehealth: Payer: Medicare Other | Admitting: Cardiology

## 2020-11-16 ENCOUNTER — Other Ambulatory Visit: Payer: Self-pay

## 2020-11-16 DIAGNOSIS — Z79899 Other long term (current) drug therapy: Secondary | ICD-10-CM | POA: Diagnosis not present

## 2020-11-17 LAB — BASIC METABOLIC PANEL
BUN/Creatinine Ratio: 10 — ABNORMAL LOW (ref 12–28)
BUN: 13 mg/dL (ref 8–27)
CO2: 28 mmol/L (ref 20–29)
Calcium: 9 mg/dL (ref 8.7–10.3)
Chloride: 89 mmol/L — ABNORMAL LOW (ref 96–106)
Creatinine, Ser: 1.24 mg/dL — ABNORMAL HIGH (ref 0.57–1.00)
GFR calc Af Amer: 49 mL/min/{1.73_m2} — ABNORMAL LOW (ref >59)
GFR calc non Af Amer: 43 mL/min/{1.73_m2} — ABNORMAL LOW (ref >59)
Glucose: 96 mg/dL (ref 65–99)
Potassium: 3.7 mmol/L (ref 3.5–5.2)
Sodium: 132 mmol/L — ABNORMAL LOW (ref 134–144)

## 2020-11-30 ENCOUNTER — Ambulatory Visit
Admission: RE | Admit: 2020-11-30 | Discharge: 2020-11-30 | Disposition: A | Discharge status: Home / Self Care | Payer: Medicare Other | Source: Ambulatory Visit | Attending: Gastroenterology | Admitting: Gastroenterology

## 2020-11-30 DIAGNOSIS — K8020 Calculus of gallbladder without cholecystitis without obstruction: Secondary | ICD-10-CM | POA: Diagnosis not present

## 2020-11-30 DIAGNOSIS — K839 Disease of biliary tract, unspecified: Secondary | ICD-10-CM

## 2020-11-30 MED ORDER — GADOBENATE DIMEGLUMINE 529 MG/ML IV SOLN: 11.0000 mL | Freq: Once | INTRAVENOUS | Status: DC | PRN

## 2020-11-30 MED ORDER — GADOBENATE DIMEGLUMINE 529 MG/ML IV SOLN
11.0000 mL | Freq: Once | INTRAVENOUS | Status: AC | PRN
  Administered 2020-11-30: 11 mL via INTRAVENOUS

## 2020-12-27 DIAGNOSIS — M810 Age-related osteoporosis without current pathological fracture: Secondary | ICD-10-CM | POA: Diagnosis not present

## 2020-12-27 DIAGNOSIS — I10 Essential (primary) hypertension: Secondary | ICD-10-CM | POA: Diagnosis not present

## 2020-12-27 DIAGNOSIS — K219 Gastro-esophageal reflux disease without esophagitis: Secondary | ICD-10-CM | POA: Diagnosis not present

## 2020-12-27 DIAGNOSIS — G8929 Other chronic pain: Secondary | ICD-10-CM | POA: Diagnosis not present

## 2020-12-27 DIAGNOSIS — E7800 Pure hypercholesterolemia, unspecified: Secondary | ICD-10-CM | POA: Diagnosis not present

## 2021-01-10 DIAGNOSIS — R404 Transient alteration of awareness: Secondary | ICD-10-CM | POA: Diagnosis not present

## 2021-01-10 DIAGNOSIS — R4020 Unspecified coma: Secondary | ICD-10-CM | POA: Diagnosis not present

## 2021-01-10 DIAGNOSIS — E860 Dehydration: Secondary | ICD-10-CM | POA: Diagnosis not present

## 2021-01-14 ENCOUNTER — Encounter: Payer: Self-pay | Admitting: Cardiology

## 2021-01-30 DIAGNOSIS — 419620001 Death: Secondary | SNOMED CT | POA: Diagnosis not present

## 2021-01-30 DEATH — deceased

## 2021-09-28 IMAGING — DX DG CHEST 1V PORT
1 series · 1 of 1 positions shown · non-contrast
Comparison: 12/03/2016

CLINICAL DATA: Weakness, anorexia

EXAM:
PORTABLE CHEST 1 VIEW

[chest ap]
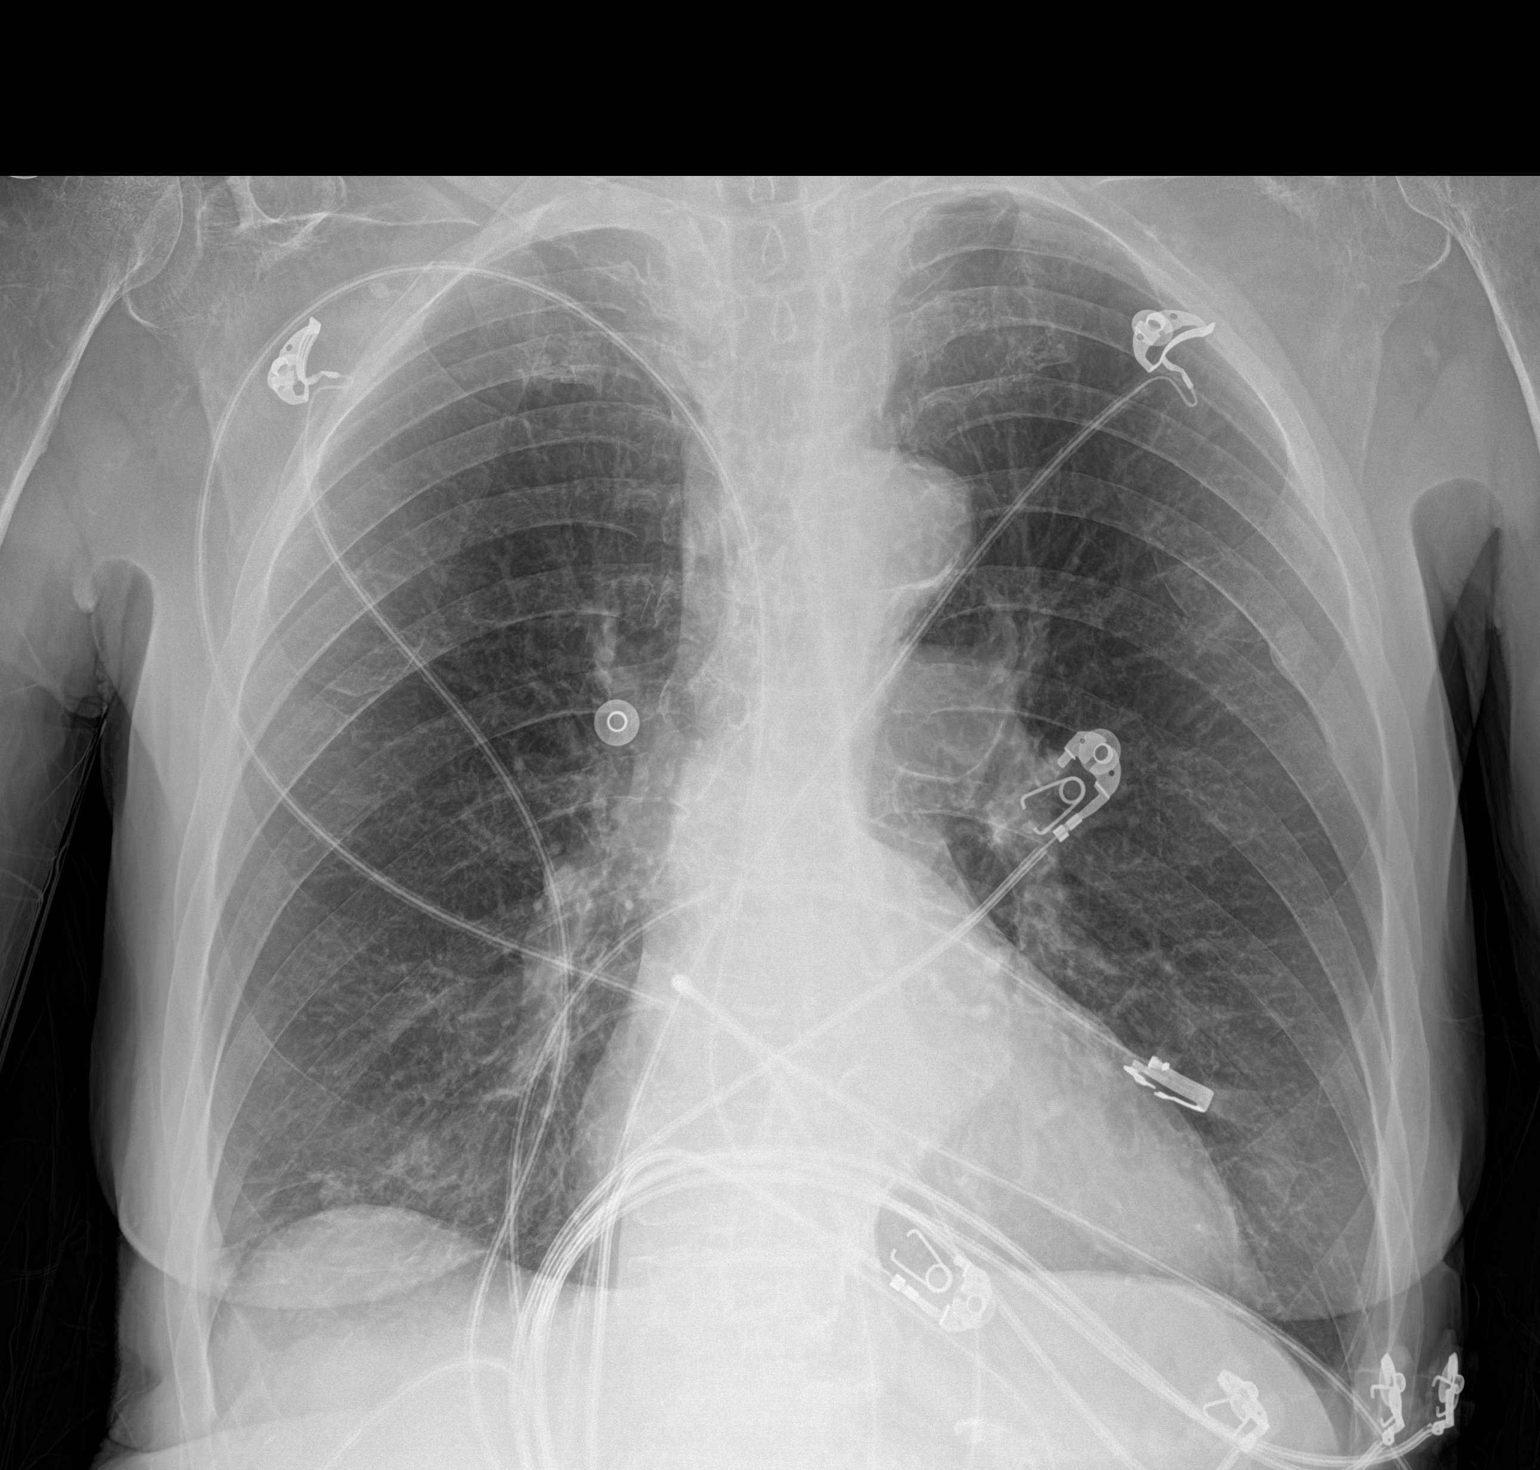

[1 of 1 positions shown; findings below may reference images not displayed]

FINDINGS: The lungs are mildly hyperinflated suggesting changes of underlying
COPD. The lungs are clear. No pneumothorax or pleural effusion.
Cardiac size is within normal limits. Pulmonary vascularity normal.
No acute bone abnormality.
IMPRESSION: No acute cardiopulmonary process.  COPD.

## 2021-09-29 IMAGING — US US RENAL
1 series · 14 of 25 positions shown · non-contrast
Comparison: Prior CT from earlier the same day.

CLINICAL DATA: Initial evaluation for acute kidney injury.

EXAM:
RENAL / URINARY TRACT ULTRASOUND COMPLETE

[Series 1: us renal · 14 of 33 slices shown]
[im 1/33]
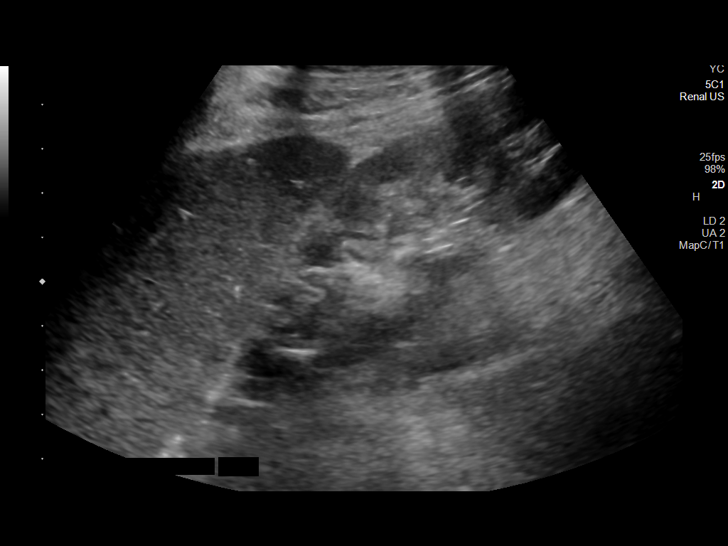
[im 3/33]
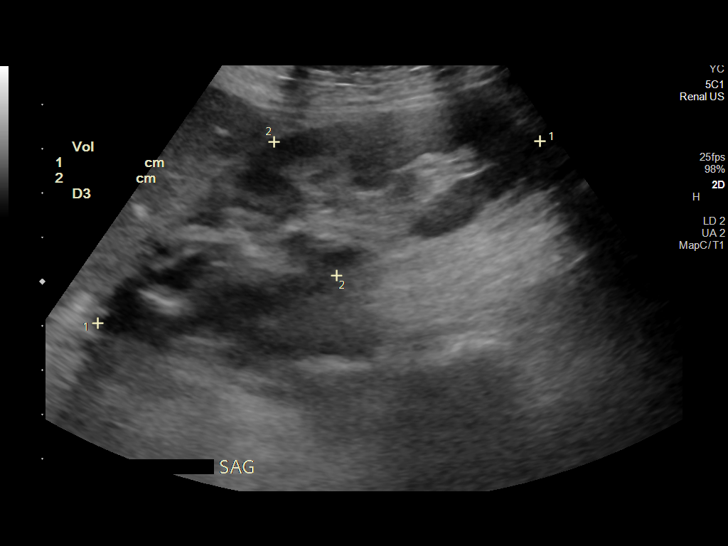
[im 6/33]
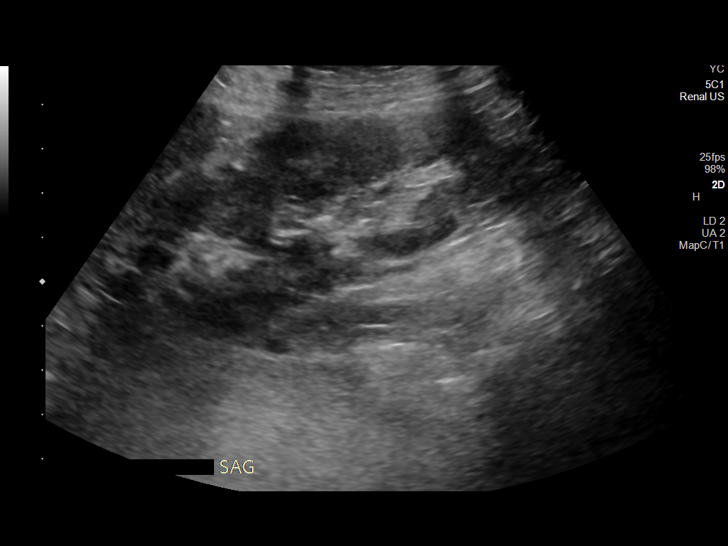
[im 9/33]
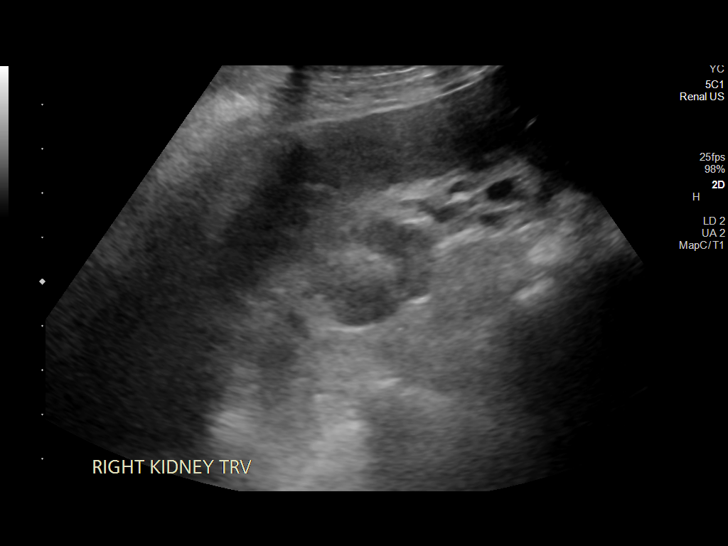
[im 11/33]
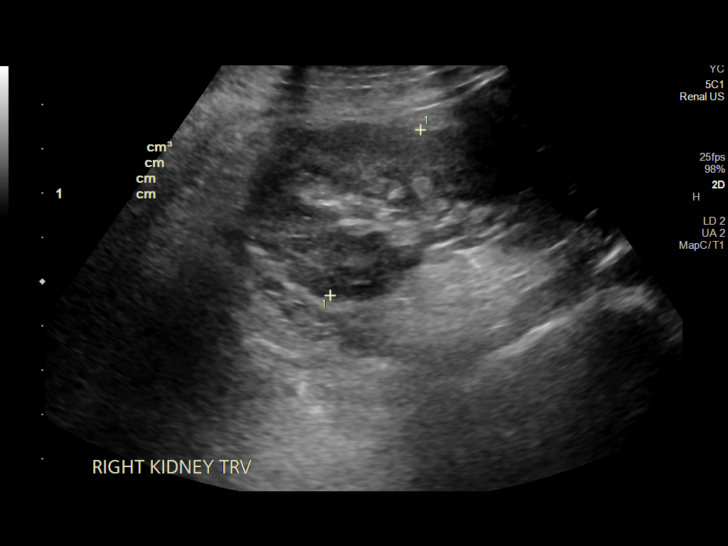
[im 13/33]
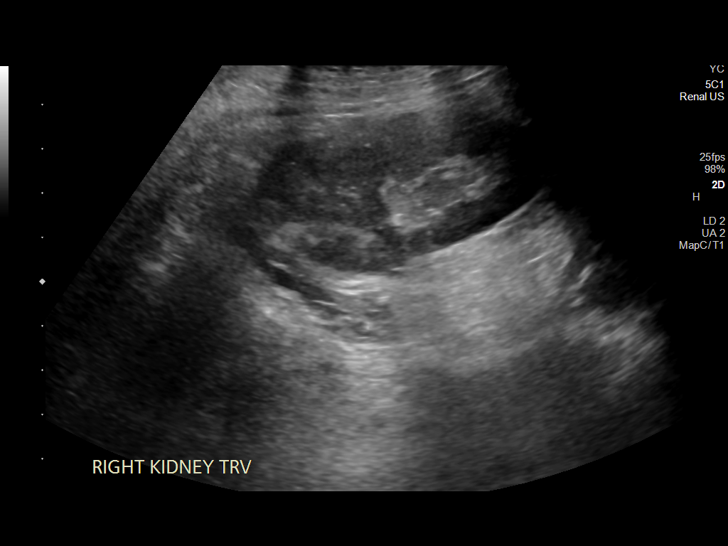
[im 15/33]
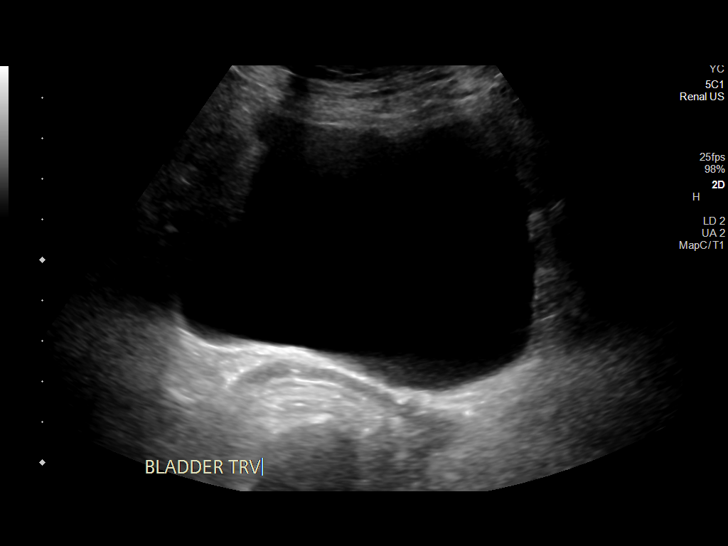
[im 18/33]
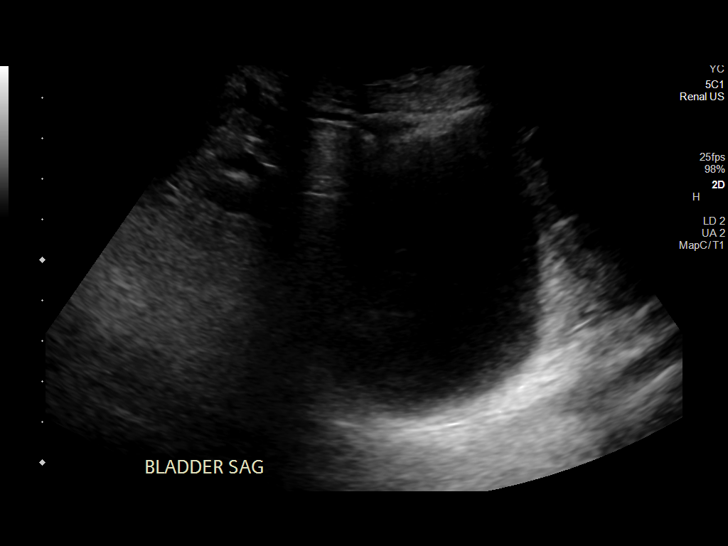
[im 21/33]
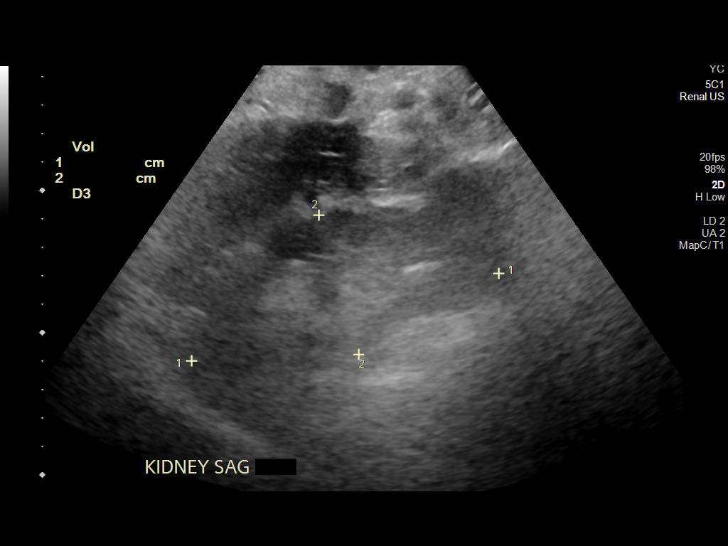
[im 22/33]
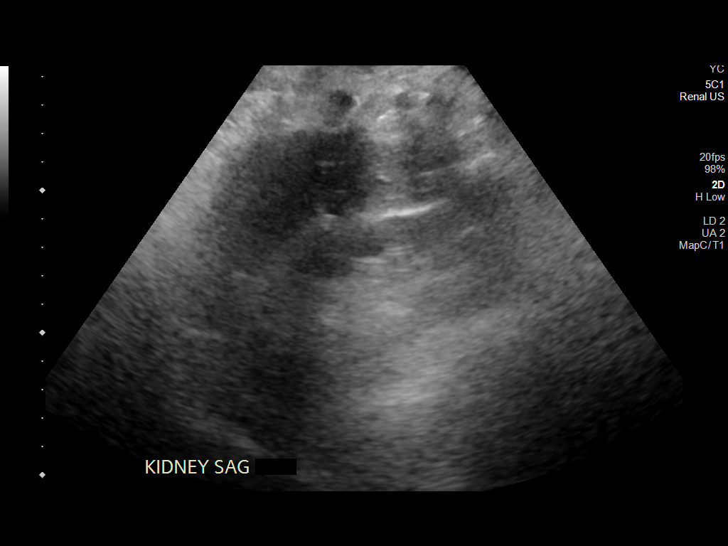
[im 25/33]
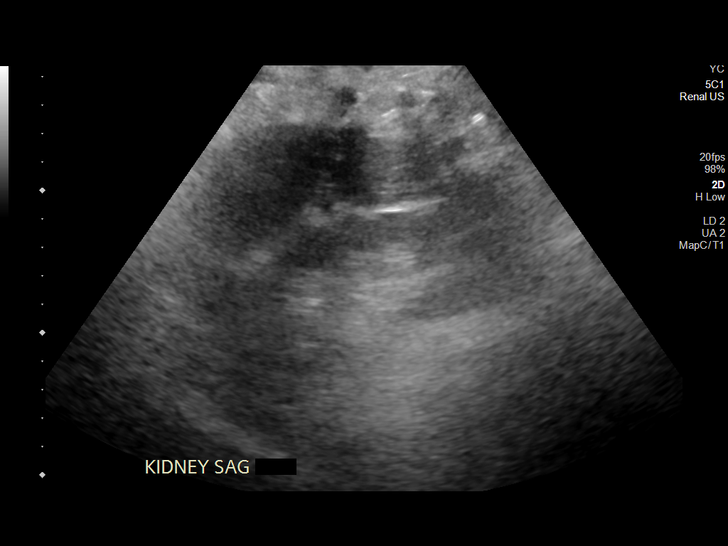
[im 27/33]
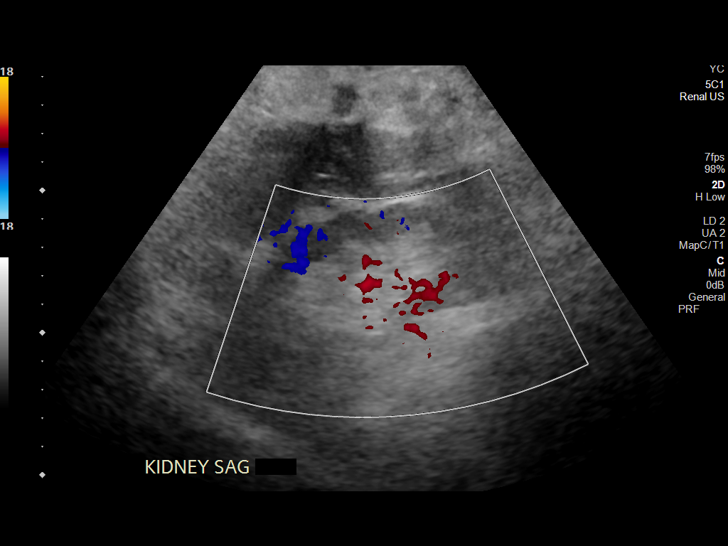
[im 30/33]
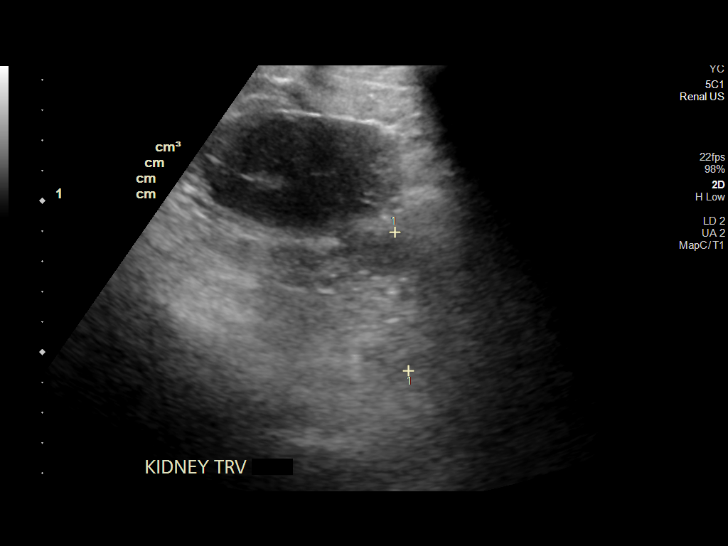
[im 33/33]
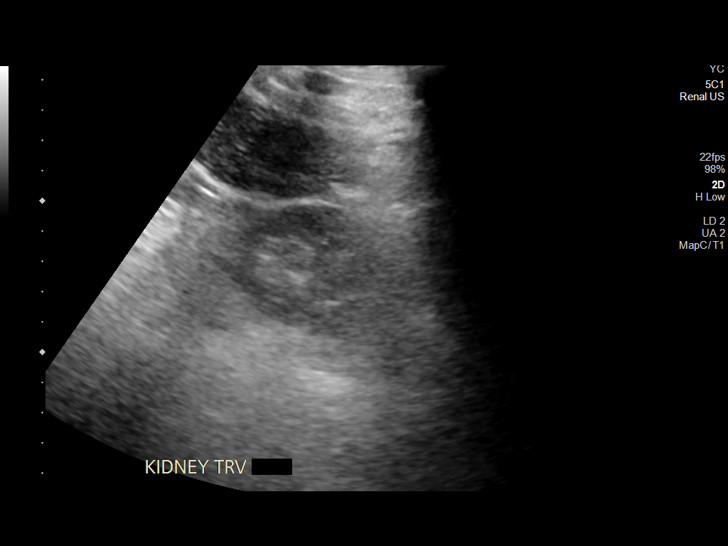

[14 of 25 positions shown; findings below may reference images not displayed]

FINDINGS: Right Kidney:

Renal measurements: 10.8 x 3.3 x 4.3 cm = volume: 80.2 mL. Renal
echogenicity within normal limits. No nephrolithiasis or
hydronephrosis. No focal renal mass.

Left Kidney:

Renal measurements: 11.2 x 5.1 x 4.6 cm = volume: 137.6 mL.
Evaluation left kidney somewhat limited by positioning and body
habitus. Visualized renal parenchyma grossly within normal limits.
No nephrolithiasis or hydronephrosis. No focal renal mass.

Bladder:

Appears normal for degree of bladder distention.

Other:

None.
IMPRESSION: Normal renal ultrasound. No hydronephrosis or other significant
finding.

## 2021-09-29 IMAGING — US US ABDOMEN LIMITED
1 series · 14 of 25 positions shown · non-contrast
Comparison: CT abdomen pelvis from same day.

CLINICAL DATA: Nausea and diarrhea for the past 5 days.

EXAM:
ULTRASOUND ABDOMEN LIMITED RIGHT UPPER QUADRANT

[Series 1: us abdomen limited ruq · 14 of 40 slices shown]
[im 1/40]
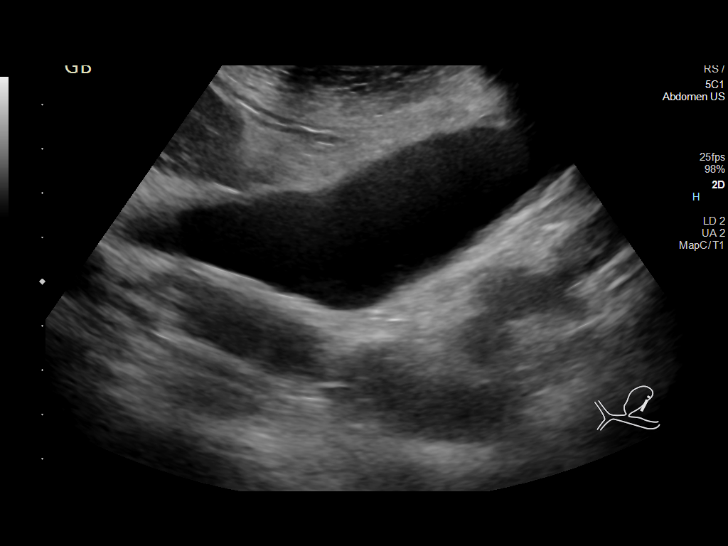
[im 4/40]
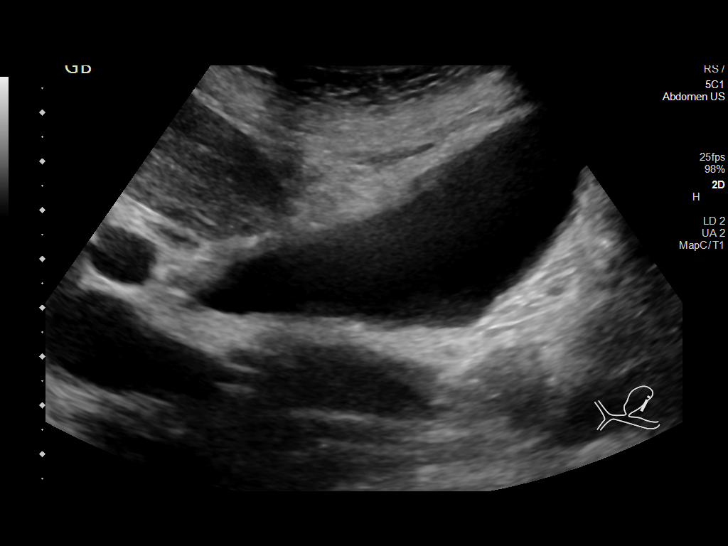
[im 7/40]
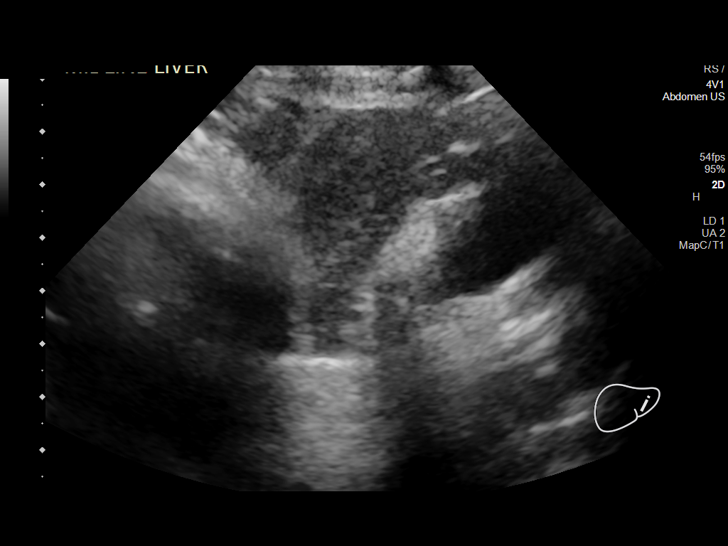
[im 10/40]
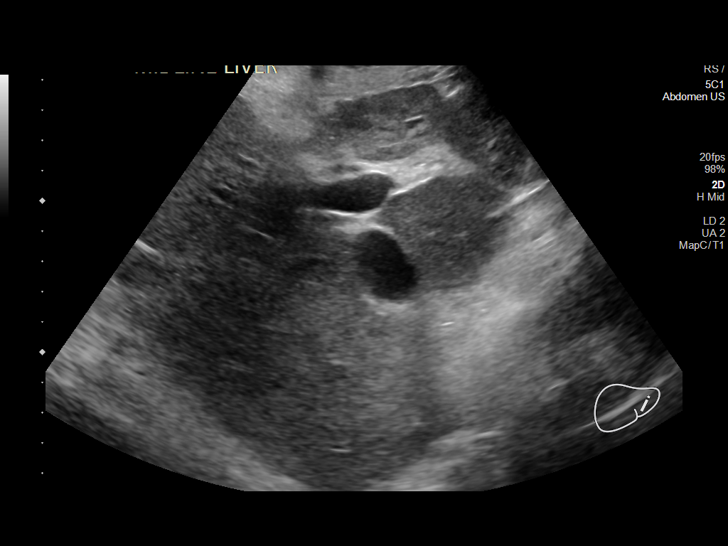
[im 14/40]
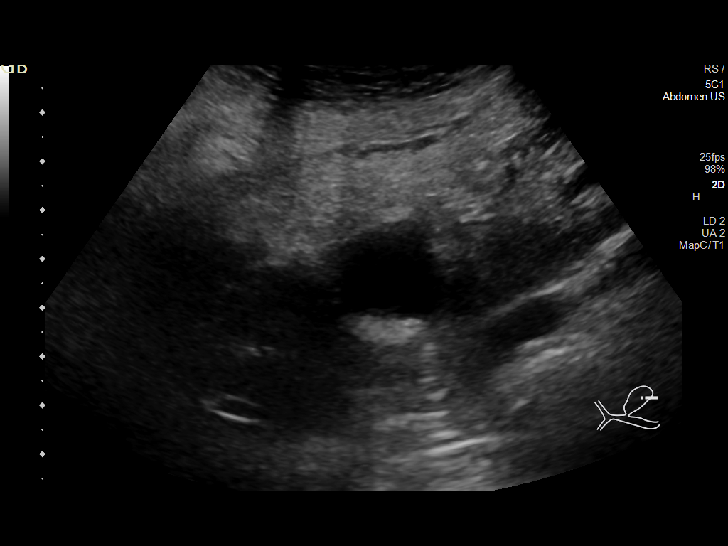
[im 15/40]
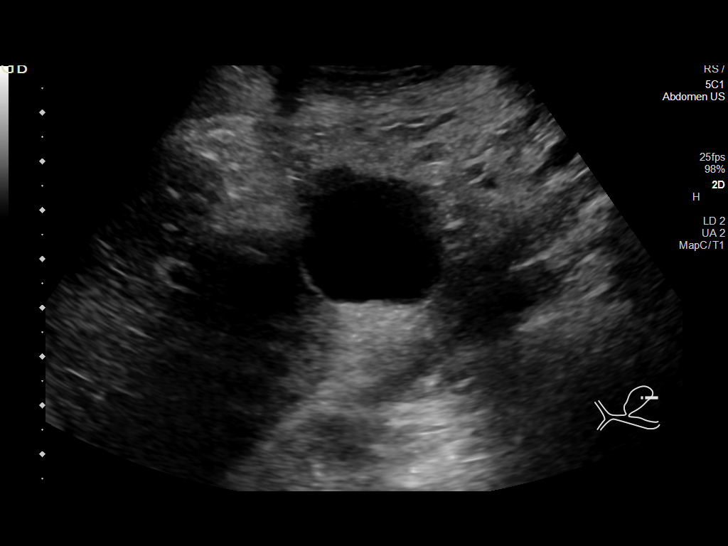
[im 18/40]
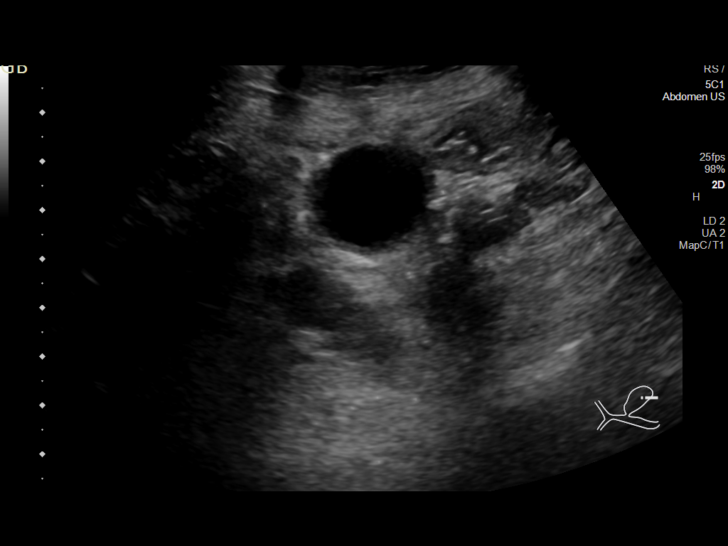
[im 22/40]
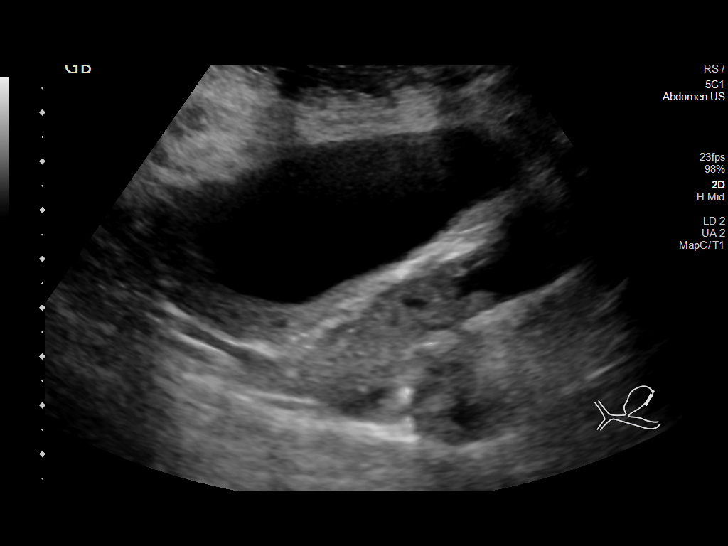
[im 25/40]
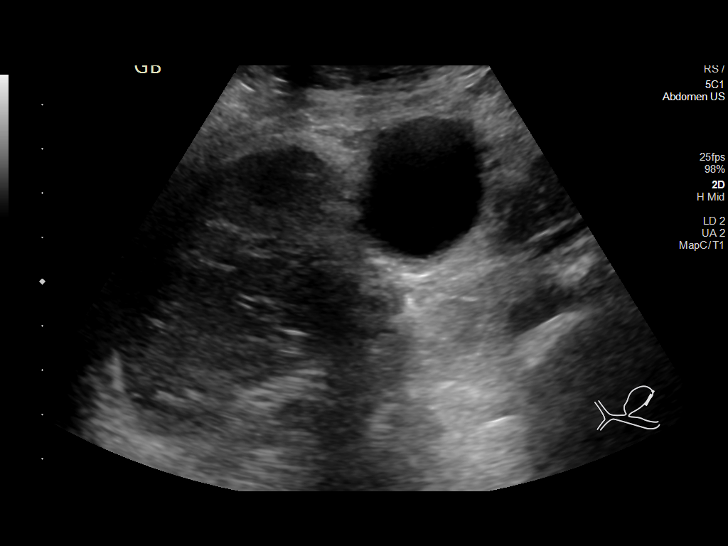
[im 27/40]
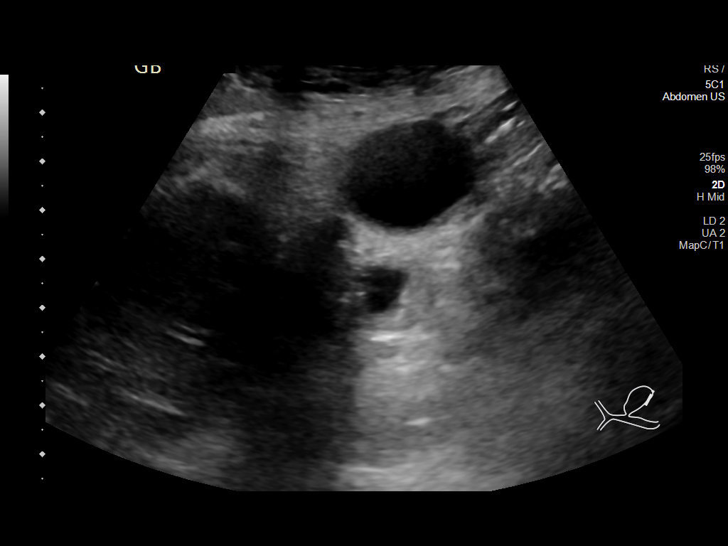
[im 30/40]
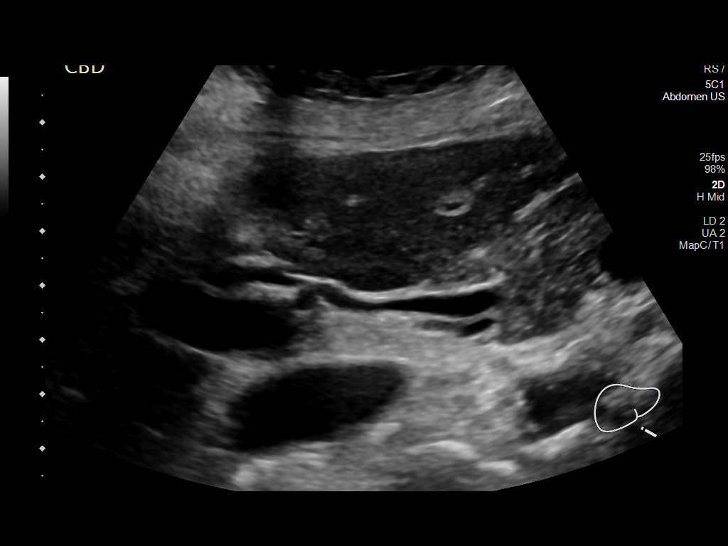
[im 33/40]
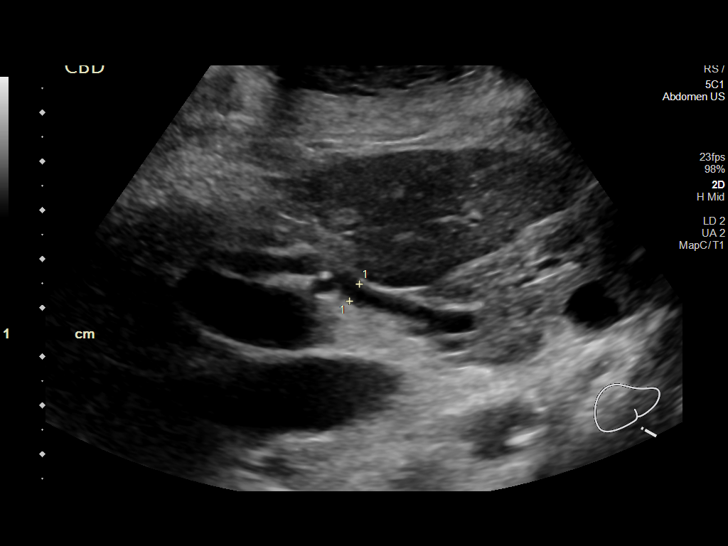
[im 36/40]
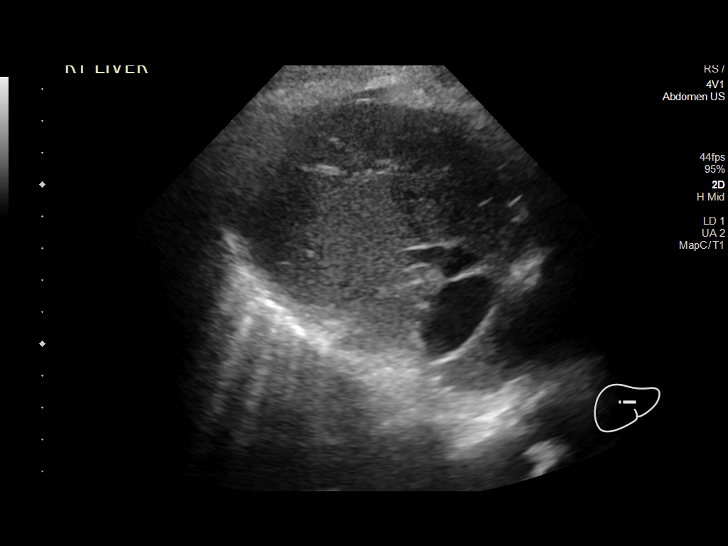
[im 40/40]
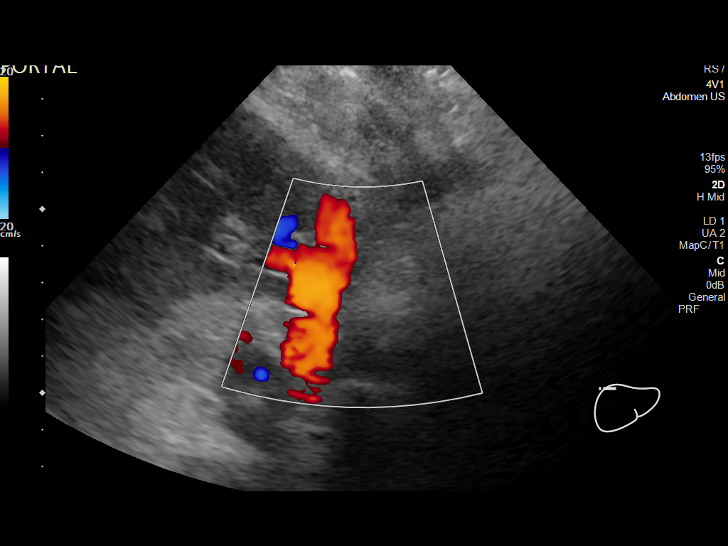

[14 of 25 positions shown; findings below may reference images not displayed]

FINDINGS: Gallbladder:

Small gallstones and sludge. No wall thickening visualized. No
sonographic Murphy sign noted by sonographer.

Common bile duct:

Diameter: 4 mm, normal.

Liver:

No focal lesion identified. Within normal limits in parenchymal
echogenicity. Portal vein is patent on color Doppler imaging with
normal direction of blood flow towards the liver.

Other: None.
IMPRESSION: 1. No acute abnormality.
2. Chronic cholelithiasis and sludge.
